# Patient Record
Sex: Female | Born: 1965 | Race: White | Hispanic: No | Marital: Married | Smoking: Current every day smoker
Health system: Southern US, Community
[De-identification: ages and names within clinical notes are randomized; demographics above are authoritative.]

## PROBLEM LIST (undated history)

## (undated) DIAGNOSIS — E079 Disorder of thyroid, unspecified: Secondary | ICD-10-CM

## (undated) DIAGNOSIS — K5792 Diverticulitis of intestine, part unspecified, without perforation or abscess without bleeding: Secondary | ICD-10-CM

## (undated) DIAGNOSIS — I10 Essential (primary) hypertension: Secondary | ICD-10-CM

## (undated) HISTORY — PX: CHOLECYSTECTOMY: SHX55

## (undated) HISTORY — DX: Essential (primary) hypertension: I10

## (undated) HISTORY — DX: Diverticulitis of intestine, part unspecified, without perforation or abscess without bleeding: K57.92

---

## 2015-07-15 ENCOUNTER — Encounter: Payer: Self-pay | Admitting: Family Medicine

## 2015-07-15 ENCOUNTER — Ambulatory Visit (INDEPENDENT_AMBULATORY_CARE_PROVIDER_SITE_OTHER): Payer: 59 | Admitting: Family Medicine

## 2015-07-15 VITALS — BP 190/108 | HR 103 | Ht 62.0 in | Wt 163.0 lb

## 2015-07-15 DIAGNOSIS — R1013 Epigastric pain: Secondary | ICD-10-CM

## 2015-07-15 DIAGNOSIS — F5089 Other specified eating disorder: Secondary | ICD-10-CM

## 2015-07-15 DIAGNOSIS — Z23 Encounter for immunization: Secondary | ICD-10-CM | POA: Diagnosis not present

## 2015-07-15 DIAGNOSIS — F411 Generalized anxiety disorder: Secondary | ICD-10-CM | POA: Diagnosis not present

## 2015-07-15 DIAGNOSIS — I10 Essential (primary) hypertension: Secondary | ICD-10-CM | POA: Insufficient documentation

## 2015-07-15 DIAGNOSIS — N951 Menopausal and female climacteric states: Secondary | ICD-10-CM

## 2015-07-15 DIAGNOSIS — R109 Unspecified abdominal pain: Secondary | ICD-10-CM | POA: Insufficient documentation

## 2015-07-15 MED ORDER — LISINOPRIL 10 MG PO TABS: 10.0000 mg | ORAL_TABLET | Freq: Every day | ORAL | Status: AC

## 2015-07-15 MED ORDER — BUSPIRONE HCL 5 MG PO TABS: 5.0000 mg | ORAL_TABLET | Freq: Three times a day (TID) | ORAL | Status: DC

## 2015-07-15 NOTE — Patient Instructions (Signed)
Thank you for coming in today. Start lisinopril.  Take buspar for anxiety as needed.  Attend therapy.  Return in 2 weeks.  Get fasting labs.  If your belly pain worsens, or you have high fever, bad vomiting, blood in your stool or black tarry stool go to the Emergency Room.   Cholelithiasis Cholelithiasis (also called gallstones) is a form of gallbladder disease in which gallstones form in your gallbladder. The gallbladder is an organ that stores bile made in the liver, which helps digest fats. Gallstones begin as small crystals and slowly grow into stones. Gallstone pain occurs when the gallbladder spasms and a gallstone is blocking the duct. Pain can also occur when a stone passes out of the duct.  RISK FACTORS  Being female.   Having multiple pregnancies. Health care providers sometimes advise removing diseased gallbladders before future pregnancies.   Being obese.  Eating a diet heavy in fried foods and fat.   Being older than 60 years and increasing age.   Prolonged use of medicines containing female hormones.   Having diabetes mellitus.   Rapidly losing weight.   Having a family history of gallstones (heredity).  SYMPTOMS  Nausea.   Vomiting.  Abdominal pain.   Yellowing of the skin (jaundice).   Sudden pain. It may persist from several minutes to several hours.  Fever.   Tenderness to the touch. In some cases, when gallstones do not move into the bile duct, people have no pain or symptoms. These are called "silent" gallstones.  TREATMENT Silent gallstones do not need treatment. In severe cases, emergency surgery may be required. Options for treatment include:  Surgery to remove the gallbladder. This is the most common treatment.  Medicines. These do not always work and may take 6-12 months or more to work.  Shock wave treatment (extracorporeal biliary lithotripsy). In this treatment an ultrasound machine sends shock waves to the gallbladder to  break gallstones into smaller pieces that can pass into the intestines or be dissolved by medicine. HOME CARE INSTRUCTIONS   Only take over-the-counter or prescription medicines for pain, discomfort, or fever as directed by your health care provider.   Follow a low-fat diet until seen again by your health care provider. Fat causes the gallbladder to contract, which can result in pain.   Follow up with your health care provider as directed. Attacks are almost always recurrent and surgery is usually required for permanent treatment.  SEEK IMMEDIATE MEDICAL CARE IF:   Your pain increases and is not controlled by medicines.   You have a fever or persistent symptoms for more than 2-3 days.   You have a fever and your symptoms suddenly get worse.   You have persistent nausea and vomiting.  MAKE SURE YOU:   Understand these instructions.  Will watch your condition.  Will get help right away if you are not doing well or get worse.   This information is not intended to replace advice given to you by your health care provider. Make sure you discuss any questions you have with your health care provider.   Document Released: 04/07/2005 Document Revised: 12/12/2012 Document Reviewed: 10/03/2012 Elsevier Interactive Patient Education 2016 [employer].  Hypertension Hypertension, commonly called high blood pressure, is when the force of blood pumping through your arteries is too strong. Your arteries are the blood vessels that carry blood from your heart throughout your body. A blood pressure reading consists of a higher number over a lower number, such as 110/72. The higher  number (systolic) is the pressure inside your arteries when your heart pumps. The lower number (diastolic) is the pressure inside your arteries when your heart relaxes. Ideally you want your blood pressure below 120/80. Hypertension forces your heart to work harder to pump blood. Your arteries may become narrow or  stiff. Having untreated or uncontrolled hypertension can cause heart attack, stroke, kidney disease, and other problems. RISK FACTORS Some risk factors for high blood pressure are controllable. Others are not.  Risk factors you cannot control include:   Race. You may be at higher risk if you are African American.  Age. Risk increases with age.  Gender. Men are at higher risk than women before age 67 years. After age 75, women are at higher risk than men. Risk factors you can control include:  Not getting enough exercise or physical activity.  Being overweight.  Getting too much fat, sugar, calories, or salt in your diet.  Drinking too much alcohol. SIGNS AND SYMPTOMS Hypertension does not usually cause signs or symptoms. Extremely high blood pressure (hypertensive crisis) may cause headache, anxiety, shortness of breath, and nosebleed. DIAGNOSIS To check if you have hypertension, your health care provider will measure your blood pressure while you are seated, with your arm held at the level of your heart. It should be measured at least twice using the same arm. Certain conditions can cause a difference in blood pressure between your right and left arms. A blood pressure reading that is higher than normal on one occasion does not mean that you need treatment. If it is not clear whether you have high blood pressure, you may be asked to return on a different day to have your blood pressure checked again. Or, you may be asked to monitor your blood pressure at home for 1 or more weeks. TREATMENT Treating high blood pressure includes making lifestyle changes and possibly taking medicine. Living a healthy lifestyle can help lower high blood pressure. You may need to change some of your habits. Lifestyle changes may include:  Following the DASH diet. This diet is high in fruits, vegetables, and whole grains. It is low in salt, red meat, and added sugars.  Keep your sodium intake below 2,300 mg  per day.  Getting at least 30-45 minutes of aerobic exercise at least 4 times per week.  Losing weight if necessary.  Not smoking.  Limiting alcoholic beverages.  Learning ways to reduce stress. Your health care provider may prescribe medicine if lifestyle changes are not enough to get your blood pressure under control, and if one of the following is true:  You are 3-62 years of age and your systolic blood pressure is above 140.  You are 71 years of age or older, and your systolic blood pressure is above 150.  Your diastolic blood pressure is above 90.  You have diabetes, and your systolic blood pressure is over 140 or your diastolic blood pressure is over 90.  You have kidney disease and your blood pressure is above 140/90.  You have heart disease and your blood pressure is above 140/90. Your personal target blood pressure may vary depending on your medical conditions, your age, and other factors. HOME CARE INSTRUCTIONS  Have your blood pressure rechecked as directed by your health care provider.   Take medicines only as directed by your health care provider. Follow the directions carefully. Blood pressure medicines must be taken as prescribed. The medicine does not work as well when you skip doses. Skipping doses also puts  you at risk for problems.  Do not smoke.   Monitor your blood pressure at home as directed by your health care provider. SEEK MEDICAL CARE IF:   You think you are having a reaction to medicines taken.  You have recurrent headaches or feel dizzy.  You have swelling in your ankles.  You have trouble with your vision. SEEK IMMEDIATE MEDICAL CARE IF:  You develop a severe headache or confusion.  You have unusual weakness, numbness, or feel faint.  You have severe chest or abdominal pain.  You vomit repeatedly.  You have trouble breathing. MAKE SURE YOU:   Understand these instructions.  Will watch your condition.  Will get help right  away if you are not doing well or get worse.   This information is not intended to replace advice given to you by your health care provider. Make sure you discuss any questions you have with your health care provider.   Document Released: 04/11/2005 Document Revised: 08/26/2014 Document Reviewed: 02/01/2013 Elsevier Interactive Patient Education [employer]2016 Elsevier Inc.

## 2015-07-16 DIAGNOSIS — N951 Menopausal and female climacteric states: Secondary | ICD-10-CM | POA: Insufficient documentation

## 2015-07-16 NOTE — Assessment & Plan Note (Signed)
Start lisinopril. Obtain fasting labs including CBC CMP and lipids

## 2015-07-16 NOTE — Assessment & Plan Note (Signed)
Check CBC ferritin and TIBC

## 2015-07-16 NOTE — Progress Notes (Signed)
       Jeanette Hughes is a 50 y.o. female who presents to [facility]: Primary Care today for establish care.  1) anxiety: Patient has had a several month history of anxiety worsening recently. She notes she typically has anxiety symptoms at work and feels overwhelmed and as though she would like to go home. She denies any panic attacks. She has not had any treatment or evaluation for this problem yet.  2) hypertension: Patient has been diagnosed with hypertension in the past. She previously was taking lisinopril 2.5 mg. No chest pains palpitations or shortness of breath.  3) history of anemia: Patient has a history of iron deficiency anemia. She currently experiences ice pica.   4) possible perimenopausal: Patient notes that she currently is perimenopausal with her last visit. In February. She typically has heavy menstrual periods. She drinks his mood changes and some hot flashes.  5) additionally patient notes occasional epigastric abdominal pains. These occur about 30-60 minutes after eating a heavy fatty meal. Been ongoing now for months. This does not happen every day. She currently is pain-free. She denies any vomiting or diarrhea or blood in the stool.   Past Medical History  Diagnosis Date  . Hypertension   . Diverticulitis    History reviewed. No pertinent past surgical history. Social History  Substance Use Topics  . Smoking status: Current Every Day Smoker  . Smokeless tobacco: Not on file  . Alcohol Use: Yes   family history includes Cancer in her sister; Heart disease in her father and mother.  ROS:  No headache, visual changes, nausea, vomiting, diarrhea, constipation, dizziness, , skin rash, fevers, chills, night sweats, weight loss, swollen lymph nodes, body aches, joint swelling, muscle aches, chest pain, shortness of breath, , visual or auditory hallucinations.    Medications: Current Outpatient Prescriptions  Medication Sig Dispense Refill  . levothyroxine (SYNTHROID, LEVOTHROID) 200 MCG tablet Take by mouth.    Marland Kitchen. omeprazole (PRILOSEC) 10 MG capsule Take by mouth.    . busPIRone (BUSPAR) 5 MG tablet Take 1 tablet (5 mg total) by mouth 3 (three) times daily. 90 tablet 0  . lisinopril (PRINIVIL,ZESTRIL) 10 MG tablet Take 1 tablet (10 mg total) by mouth daily. 30 tablet 3   No current facility-administered medications for this visit.   No Known Allergies   Exam:  BP 190/108 mmHg  Pulse 103  Ht 5\' 2"  (1.575 m)  Wt 163 lb (73.936 kg)  BMI 29.81 kg/m2 Gen: Well NAD HEENT: EOMI,  MMM Lungs: Normal work of breathing. CTABL Heart: RRR no MRG Abd: NABS, Soft. Nondistended, Nontender negative Murphy sign  Exts: Brisk capillary refill, warm and well perfused.  Psych: Alert and oriented normal speech thought process and affect  PHQ9 is 7 GAD 7 is 9  No results found for this or any previous visit (from the past 24 hour(s)). No results found.   Please see individual assessment and plan sections.   Patient was given a Tdap prior to discharge.

## 2015-07-16 NOTE — Assessment & Plan Note (Signed)
Etiology. Possible cholelithiasis versus pancreatitis versus transaminitis. Discussed options. CMP pending.

## 2015-07-16 NOTE — Assessment & Plan Note (Addendum)
I think patient likely has general anxiety disorder. She is reluctant to start taking medicines daily. Use buspirone. Refer to counseling. Check TSH. Recheck in 2-4 weeks

## 2015-07-16 NOTE — Assessment & Plan Note (Signed)
Check estrogen progesterone FSH and LH. Refer to OB/GYN for replacement therapy as needed

## 2015-07-22 LAB — CBC
HCT: 34.6 % — ABNORMAL LOW (ref 36.0–46.0)
HEMOGLOBIN: 10.2 g/dL — AB (ref 12.0–15.0)
MCH: 20.1 pg — ABNORMAL LOW (ref 26.0–34.0)
MCHC: 29.5 g/dL — ABNORMAL LOW (ref 30.0–36.0)
MCV: 68.1 fL — ABNORMAL LOW (ref 78.0–100.0)
MPV: 9 fL (ref 8.6–12.4)
PLATELETS: 382 10*3/uL (ref 150–400)
RBC: 5.08 MIL/uL (ref 3.87–5.11)
RDW: 18.2 % — ABNORMAL HIGH (ref 11.5–15.5)
WBC: 10.9 10*3/uL — AB (ref 4.0–10.5)

## 2015-07-23 ENCOUNTER — Encounter: Payer: Self-pay | Admitting: Family Medicine

## 2015-07-23 DIAGNOSIS — D509 Iron deficiency anemia, unspecified: Secondary | ICD-10-CM | POA: Insufficient documentation

## 2015-07-23 DIAGNOSIS — R7989 Other specified abnormal findings of blood chemistry: Secondary | ICD-10-CM | POA: Insufficient documentation

## 2015-07-23 DIAGNOSIS — E559 Vitamin D deficiency, unspecified: Secondary | ICD-10-CM | POA: Insufficient documentation

## 2015-07-23 DIAGNOSIS — R7303 Prediabetes: Secondary | ICD-10-CM | POA: Insufficient documentation

## 2015-07-23 LAB — COMPREHENSIVE METABOLIC PANEL
ALBUMIN: 4.3 g/dL (ref 3.6–5.1)
ALT: 16 U/L (ref 6–29)
AST: 15 U/L (ref 10–35)
Alkaline Phosphatase: 63 U/L (ref 33–115)
BILIRUBIN TOTAL: 0.3 mg/dL (ref 0.2–1.2)
BUN: 18 mg/dL (ref 7–25)
CO2: 22 mmol/L (ref 20–31)
CREATININE: 0.85 mg/dL (ref 0.50–1.10)
Calcium: 9.3 mg/dL (ref 8.6–10.2)
Chloride: 105 mmol/L (ref 98–110)
Glucose, Bld: 102 mg/dL — ABNORMAL HIGH (ref 65–99)
Potassium: 4.8 mmol/L (ref 3.5–5.3)
SODIUM: 137 mmol/L (ref 135–146)
TOTAL PROTEIN: 7.8 g/dL (ref 6.1–8.1)

## 2015-07-23 LAB — FSH/LH
FSH: 2.8 m[IU]/mL
LH: 6.4 m[IU]/mL

## 2015-07-23 LAB — HEMOGLOBIN A1C
HEMOGLOBIN A1C: 5.9 % — AB (ref <5.7)
MEAN PLASMA GLUCOSE: 123 mg/dL

## 2015-07-23 LAB — LIPID PANEL
CHOLESTEROL: 170 mg/dL (ref 125–200)
HDL: 62 mg/dL (ref >=46)
LDL CALC: 85 mg/dL (ref <130)
TRIGLYCERIDES: 116 mg/dL (ref <150)
Total CHOL/HDL Ratio: 2.7 Ratio (ref <=5.0)
VLDL: 23 mg/dL (ref <30)

## 2015-07-23 LAB — IRON AND TIBC
%SAT: 3 % — ABNORMAL LOW (ref 11–50)
Iron: 16 ug/dL — ABNORMAL LOW (ref 40–190)
TIBC: 596 ug/dL — AB (ref 250–450)
UIBC: 580 ug/dL — ABNORMAL HIGH (ref 125–400)

## 2015-07-23 LAB — VITAMIN D 25 HYDROXY (VIT D DEFICIENCY, FRACTURES): VIT D 25 HYDROXY: 11 ng/mL — AB (ref 30–100)

## 2015-07-23 LAB — LIPASE: Lipase: 22 U/L (ref 7–60)

## 2015-07-23 LAB — FERRITIN: FERRITIN: 5 ng/mL — AB (ref 10–232)

## 2015-07-23 LAB — PROGESTERONE: Progesterone: 10.6 ng/mL

## 2015-07-23 LAB — TSH: TSH: 70.43 mIU/L — ABNORMAL HIGH

## 2015-07-23 MED ORDER — VITAMIN D (ERGOCALCIFEROL) 1.25 MG (50000 UNIT) PO CAPS
50000.0000 [IU] | ORAL_CAPSULE | ORAL | Status: AC
Start: 2015-07-23 — End: ?

## 2015-07-23 MED ORDER — FERROUS SULFATE 325 (65 FE) MG PO TBEC: 325.0000 mg | DELAYED_RELEASE_TABLET | Freq: Three times a day (TID) | ORAL | Status: DC

## 2015-07-23 NOTE — Progress Notes (Signed)
Quick Note:  Return soon to go over the labs and get some more tests.  1) Severe iron deficiency is noted. We will start taking oral iron. Take ferrous sulfate 325mg  three times daily (if able). These are avaliable over the counter but I will try to prescribe them.  2) Thyroid labs are really abnormal. We will need to repeat them. Has Ms Jeanette Hughes been off the thryoid medicine for a while? 3) Vit D is really low. I will prescribe some.  4) Prediabetes is present. Diet and weight loss.  5) Cholesterol is ok.  6) Menopause labs are pending. ______

## 2015-07-23 NOTE — Addendum Note (Signed)
Addended by: Rodolph BongOREY, Deetra Booton S on: 07/23/2015 08:03 AM   Modules accepted: Orders, SmartSet

## 2015-07-26 LAB — ESTROGENS, TOTAL: Estrogen: 435 pg/mL

## 2015-07-27 NOTE — Progress Notes (Signed)
Quick Note:  Menopause labs do not show menopause ______

## 2015-07-28 ENCOUNTER — Telehealth: Payer: Self-pay

## 2015-07-28 MED ORDER — LEVOTHYROXINE SODIUM 200 MCG PO TABS: 200.0000 ug | ORAL_TABLET | Freq: Every day | ORAL | Status: DC

## 2015-07-28 NOTE — Telephone Encounter (Signed)
TSH was really high. I am not sure what the levothyroxine dose should be. I sent in a one-month supply. Patient should return in the near future for repeat testing.

## 2015-07-28 NOTE — Telephone Encounter (Signed)
Pt called stating that she needs a refill on levothyroxine 0.25mg . She also wanted you to know that the iron supplements have made her fell better then she has in a long time.

## 2015-07-29 NOTE — Telephone Encounter (Signed)
Pt notified and advised to FU in 1 month to recheck TSH.

## 2015-08-31 ENCOUNTER — Telehealth: Payer: Self-pay | Admitting: *Deleted

## 2015-08-31 MED ORDER — LEVOTHYROXINE SODIUM 200 MCG PO TABS: 200.0000 ug | ORAL_TABLET | Freq: Every day | ORAL | Status: DC

## 2015-08-31 NOTE — Telephone Encounter (Signed)
Pt called requesting a refill on thyroid medication. Last labs show thyroid labs were very abnormal. Spoke with patient she had not been taking them regularly and she still has some pills left. Pt has a f/u appt with Dr. Denyse Amassorey on 5/18. Refilled this med until she comes in to see you. Please advise if any thing further

## 2015-09-10 ENCOUNTER — Ambulatory Visit (INDEPENDENT_AMBULATORY_CARE_PROVIDER_SITE_OTHER): Payer: 59 | Admitting: Family Medicine

## 2015-09-10 ENCOUNTER — Encounter: Payer: Self-pay | Admitting: Family Medicine

## 2015-09-10 VITALS — BP 170/110 | HR 77 | Wt 155.0 lb

## 2015-09-10 DIAGNOSIS — I10 Essential (primary) hypertension: Secondary | ICD-10-CM

## 2015-09-10 DIAGNOSIS — R7303 Prediabetes: Secondary | ICD-10-CM

## 2015-09-10 DIAGNOSIS — R7989 Other specified abnormal findings of blood chemistry: Secondary | ICD-10-CM

## 2015-09-10 DIAGNOSIS — R1013 Epigastric pain: Secondary | ICD-10-CM | POA: Diagnosis not present

## 2015-09-10 DIAGNOSIS — D509 Iron deficiency anemia, unspecified: Secondary | ICD-10-CM

## 2015-09-10 DIAGNOSIS — R946 Abnormal results of thyroid function studies: Secondary | ICD-10-CM

## 2015-09-10 DIAGNOSIS — E559 Vitamin D deficiency, unspecified: Secondary | ICD-10-CM | POA: Diagnosis not present

## 2015-09-10 DIAGNOSIS — N920 Excessive and frequent menstruation with regular cycle: Secondary | ICD-10-CM | POA: Insufficient documentation

## 2015-09-10 LAB — T4, FREE: Free T4: 1.2 ng/dL (ref 0.8–1.8)

## 2015-09-10 LAB — FERRITIN: Ferritin: 17 ng/mL (ref 10–232)

## 2015-09-10 LAB — CBC
HCT: 40.6 % (ref 35.0–45.0)
Hemoglobin: 12.9 g/dL (ref 11.7–15.5)
MCH: 25.1 pg — AB (ref 27.0–33.0)
MCHC: 31.8 g/dL — AB (ref 32.0–36.0)
MCV: 79.1 fL — AB (ref 80.0–100.0)
MPV: 9.5 fL (ref 7.5–12.5)
PLATELETS: 360 10*3/uL (ref 140–400)
RBC: 5.13 MIL/uL — ABNORMAL HIGH (ref 3.80–5.10)
WBC: 6.7 10*3/uL (ref 3.8–10.8)

## 2015-09-10 LAB — IRON AND TIBC
%SAT: 7 % — ABNORMAL LOW (ref 11–50)
Iron: 31 ug/dL — ABNORMAL LOW (ref 40–190)
TIBC: 418 ug/dL (ref 250–450)
UIBC: 387 ug/dL (ref 125–400)

## 2015-09-10 LAB — TSH: TSH: 1.81 mIU/L

## 2015-09-10 LAB — T3, FREE: T3, Free: 2.6 pg/mL (ref 2.3–4.2)

## 2015-09-10 MED ORDER — NORETHINDRONE 0.35 MG PO TABS: 1.0000 | ORAL_TABLET | Freq: Every day | ORAL | Status: DC

## 2015-09-10 NOTE — Progress Notes (Signed)
       Jeanette Hughes is a 50 y.o. female who [facility] [facility] today for follow-up multiple medical problems.  1) abdominal pain: Patient was seen 2 months ago for abdominal pain. Initial lab workup was inconclusive. She notes continued upper right quadrant abdominal pain worse with eating fatty foods. She is concerned she may have gallbladder disease. She has not eaten in over 6 hours. No fevers or chills vomiting or diarrhea. She currently takes omeprazole.  2) iron deficiency anemia: Patient was diagnosed with severe iron deficiency with moderate anemia the last visit. She's been taking oral iron. She notes she cannot take this medicine more than once daily due to constipation and upset stomach. She continues to have heavy menstrual bleeding but feels better overall with iron repletion.  3) thyroid: Patient had significantly elevated TSH the last visit. She notes that she had not been taking her thyroid medicine prior to this lab test. She has been taking her medicines but somewhat inconsistently. She denies any hair loss significant weight gain feeling too hot or feeling cold.  4) heavy menstrual bleeding: Patient notes heavy menstrual periods. She thinks this is the cause of her iron deficiency. She is reluctant to take oral estrogen-containing contraceptives due to her smoking status and risk of DVT/PE.  5) hypertension: Patient did not take her blood pressure medication this morning. No chest pains palpitations shortness of breath.     Past Medical History  Diagnosis Date  . Hypertension   . Diverticulitis    No past surgical history on file. Social History  Substance Use Topics  . Smoking status: Current Every Day Smoker  . Smokeless tobacco: Not on file  . Alcohol Use: Yes   family history includes Cancer in her sister; Heart disease in her father and mother.  ROS as  above Medications: Current Outpatient Prescriptions  Medication Sig Dispense Refill  . ferrous sulfate 325 (65 FE) MG EC tablet Take 1 tablet (325 mg total) by mouth 3 (three) times daily with meals. 90 tablet 11  . levothyroxine (SYNTHROID, LEVOTHROID) 200 MCG tablet Take 1 tablet (200 mcg total) by mouth daily before breakfast. 30 tablet 0  . lisinopril (PRINIVIL,ZESTRIL) 10 MG tablet Take 1 tablet (10 mg total) by mouth daily. 30 tablet 3  . omeprazole (PRILOSEC) 10 MG capsule Take by mouth.    . Vitamin D, Ergocalciferol, (DRISDOL) 50000 units CAPS capsule Take 1 capsule (50,000 Units total) by mouth every 7 (seven) days. Take for 8 total doses(weeks) 8 capsule 0   No current facility-administered medications for this visit.   No Known Allergies   Exam:  BP 170/110 mmHg  Pulse 77  Wt 155 lb (70.308 kg) Gen: Well NAD HEENT: EOMI,  MMM Lungs: Normal work of breathing. CTABL Heart: RRR no MRG Abd: NABS, Soft. Nondistended, Nontender Exts: Brisk capillary refill, warm and well perfused.   No results found for this or any previous visit (from the past 24 hour(s)). No results found.   Please see individual assessment and plan sections.

## 2015-09-10 NOTE — Assessment & Plan Note (Signed)
Status post partial oral repletion. Check CBC ferritin and iron binding capacity.

## 2015-09-10 NOTE — Assessment & Plan Note (Signed)
Continue lisinopril. Recheck in one month.

## 2015-09-10 NOTE — Assessment & Plan Note (Signed)
Recheck TSH free T4 and free T3.

## 2015-09-10 NOTE — Patient Instructions (Signed)
Thank you for coming in today. Get a pill box to help take your medicines more consistently. Get labs and ultrasound today. Return in one month.

## 2015-09-10 NOTE — Assessment & Plan Note (Signed)
Treat with oral progesterone only containing birth control pills to help regulate menstrual cycle. If not effective would consider IUD or endometrial ablation.

## 2015-09-10 NOTE — Assessment & Plan Note (Signed)
Check vitamin D. 

## 2015-09-10 NOTE — Assessment & Plan Note (Signed)
Concerning for gallbladder etiology. Check abdominal ultrasound.

## 2015-09-11 LAB — VITAMIN D 25 HYDROXY (VIT D DEFICIENCY, FRACTURES): Vit D, 25-Hydroxy: 35 ng/mL (ref 30–100)

## 2015-09-11 NOTE — Progress Notes (Signed)
Quick Note:  1) Vit D looks good. Continue the OTC doses.  2) Iron is improving but not fully better. Continue to take iron as much as you can.  3) Thyroid looks normal now. ______

## 2015-09-18 ENCOUNTER — Other Ambulatory Visit: Payer: 59

## 2015-09-22 ENCOUNTER — Ambulatory Visit (INDEPENDENT_AMBULATORY_CARE_PROVIDER_SITE_OTHER): Payer: 59

## 2015-09-22 DIAGNOSIS — K8020 Calculus of gallbladder without cholecystitis without obstruction: Secondary | ICD-10-CM | POA: Diagnosis not present

## 2015-09-22 DIAGNOSIS — R1013 Epigastric pain: Secondary | ICD-10-CM

## 2015-09-23 ENCOUNTER — Telehealth: Payer: Self-pay | Admitting: Family Medicine

## 2015-09-23 DIAGNOSIS — K8020 Calculus of gallbladder without cholecystitis without obstruction: Secondary | ICD-10-CM | POA: Insufficient documentation

## 2015-09-23 DIAGNOSIS — K8010 Calculus of gallbladder with chronic cholecystitis without obstruction: Secondary | ICD-10-CM

## 2015-09-23 NOTE — Telephone Encounter (Signed)
Will refer to general surgery.   

## 2015-09-23 NOTE — Progress Notes (Signed)
Quick Note:  Ultrasound shows gallstones and gallbladder wall thickening consistent with inflammation and irritation. Plan refer to general surgery ______

## 2015-10-08 ENCOUNTER — Telehealth: Payer: Self-pay

## 2015-10-08 NOTE — Telephone Encounter (Signed)
Pt called stating that the general surgeon she was referred to is out of her insurance network and would like a recommendation for a surgeon in her network. Advised pt to contact her insurance provider and request information on a provider in her network. Requested a call back with this information. Pt verbalized understanding.

## 2015-10-09 ENCOUNTER — Ambulatory Visit: Payer: Self-pay | Admitting: General Surgery

## 2015-10-20 ENCOUNTER — Other Ambulatory Visit: Payer: Self-pay | Admitting: Family Medicine

## 2016-03-19 ENCOUNTER — Other Ambulatory Visit: Payer: Self-pay | Admitting: Family Medicine

## 2016-03-20 ENCOUNTER — Other Ambulatory Visit: Payer: Self-pay | Admitting: Family Medicine

## 2019-01-28 ENCOUNTER — Other Ambulatory Visit (HOSPITAL_COMMUNITY): Payer: Self-pay | Admitting: Nurse Practitioner

## 2019-01-28 DIAGNOSIS — Z1231 Encounter for screening mammogram for malignant neoplasm of breast: Secondary | ICD-10-CM

## 2020-09-03 ENCOUNTER — Emergency Department (HOSPITAL_COMMUNITY): Payer: BC Managed Care – PPO

## 2020-09-03 ENCOUNTER — Encounter (HOSPITAL_COMMUNITY): Payer: Self-pay

## 2020-09-03 ENCOUNTER — Observation Stay (HOSPITAL_COMMUNITY)
Admission: EM | Admit: 2020-09-03 | Discharge: 2020-09-04 | Disposition: A | Discharge status: Home / Self Care | Payer: BC Managed Care – PPO | Attending: Internal Medicine | Admitting: Internal Medicine

## 2020-09-03 ENCOUNTER — Inpatient Hospital Stay (HOSPITAL_COMMUNITY): Payer: BC Managed Care – PPO

## 2020-09-03 ENCOUNTER — Other Ambulatory Visit: Payer: Self-pay

## 2020-09-03 DIAGNOSIS — Z20822 Contact with and (suspected) exposure to covid-19: Secondary | ICD-10-CM | POA: Diagnosis not present

## 2020-09-03 DIAGNOSIS — R7303 Prediabetes: Secondary | ICD-10-CM | POA: Insufficient documentation

## 2020-09-03 DIAGNOSIS — I10 Essential (primary) hypertension: Secondary | ICD-10-CM | POA: Diagnosis not present

## 2020-09-03 DIAGNOSIS — R42 Dizziness and giddiness: Secondary | ICD-10-CM | POA: Diagnosis present

## 2020-09-03 DIAGNOSIS — I639 Cerebral infarction, unspecified: Secondary | ICD-10-CM | POA: Diagnosis not present

## 2020-09-03 DIAGNOSIS — Z79899 Other long term (current) drug therapy: Secondary | ICD-10-CM | POA: Diagnosis not present

## 2020-09-03 DIAGNOSIS — E039 Hypothyroidism, unspecified: Secondary | ICD-10-CM | POA: Insufficient documentation

## 2020-09-03 DIAGNOSIS — F17210 Nicotine dependence, cigarettes, uncomplicated: Secondary | ICD-10-CM | POA: Diagnosis not present

## 2020-09-03 DIAGNOSIS — Z7982 Long term (current) use of aspirin: Secondary | ICD-10-CM | POA: Diagnosis not present

## 2020-09-03 DIAGNOSIS — E782 Mixed hyperlipidemia: Secondary | ICD-10-CM | POA: Insufficient documentation

## 2020-09-03 DIAGNOSIS — Z91040 Latex allergy status: Secondary | ICD-10-CM | POA: Insufficient documentation

## 2020-09-03 DIAGNOSIS — H538 Other visual disturbances: Secondary | ICD-10-CM

## 2020-09-03 HISTORY — DX: Disorder of thyroid, unspecified: E07.9

## 2020-09-03 LAB — CBC WITH DIFFERENTIAL/PLATELET
Abs Immature Granulocytes: 0.02 10*3/uL (ref 0.00–0.07)
Basophils Absolute: 0.1 10*3/uL (ref 0.0–0.1)
Basophils Relative: 1 %
Eosinophils Absolute: 0.2 10*3/uL (ref 0.0–0.5)
Eosinophils Relative: 2 %
HCT: 36 % (ref 36.0–46.0)
Hemoglobin: 11 g/dL — ABNORMAL LOW (ref 12.0–15.0)
Immature Granulocytes: 0 %
Lymphocytes Relative: 25 %
Lymphs Abs: 2.2 10*3/uL (ref 0.7–4.0)
MCH: 26.4 pg (ref 26.0–34.0)
MCHC: 30.6 g/dL (ref 30.0–36.0)
MCV: 86.3 fL (ref 80.0–100.0)
Monocytes Absolute: 0.6 10*3/uL (ref 0.1–1.0)
Monocytes Relative: 7 %
Neutro Abs: 5.6 10*3/uL (ref 1.7–7.7)
Neutrophils Relative %: 65 %
Platelets: 338 10*3/uL (ref 150–400)
RBC: 4.17 MIL/uL (ref 3.87–5.11)
RDW: 14.6 % (ref 11.5–15.5)
WBC: 8.7 10*3/uL (ref 4.0–10.5)
nRBC: 0 % (ref 0.0–0.2)

## 2020-09-03 LAB — COMPREHENSIVE METABOLIC PANEL
ALT: 20 U/L (ref 0–44)
AST: 18 U/L (ref 15–41)
Albumin: 3.7 g/dL (ref 3.5–5.0)
Alkaline Phosphatase: 69 U/L (ref 38–126)
Anion gap: 7 (ref 5–15)
BUN: 14 mg/dL (ref 6–20)
CO2: 24 mmol/L (ref 22–32)
Calcium: 8.6 mg/dL — ABNORMAL LOW (ref 8.9–10.3)
Chloride: 105 mmol/L (ref 98–111)
Creatinine, Ser: 0.73 mg/dL (ref 0.44–1.00)
GFR, Estimated: >60 mL/min (ref >60)
Glucose, Bld: 107 mg/dL — ABNORMAL HIGH (ref 70–99)
Potassium: 4.3 mmol/L (ref 3.5–5.1)
Sodium: 136 mmol/L (ref 135–145)
Total Bilirubin: 0.3 mg/dL (ref 0.3–1.2)
Total Protein: 7.2 g/dL (ref 6.5–8.1)

## 2020-09-03 LAB — TSH: TSH: 2.278 u[IU]/mL (ref 0.350–4.500)

## 2020-09-03 MED ORDER — CLOPIDOGREL BISULFATE 75 MG PO TABS: 75.0000 mg | ORAL_TABLET | Freq: Once | ORAL | Status: DC

## 2020-09-03 MED ORDER — PROCHLORPERAZINE EDISYLATE 10 MG/2ML IJ SOLN
10.0000 mg | Freq: Once | INTRAMUSCULAR | Status: AC
  Administered 2020-09-03: 10 mg via INTRAVENOUS
  Filled 2020-09-03: qty 2

## 2020-09-03 MED ORDER — STROKE: EARLY STAGES OF RECOVERY BOOK
Freq: Once | Status: AC
Start: 2020-09-03 — End: 2020-09-03
  Filled 2020-09-03: qty 1

## 2020-09-03 MED ORDER — ACETAMINOPHEN 650 MG RE SUPP: 650.0000 mg | RECTAL | Status: DC | PRN

## 2020-09-03 MED ORDER — CLONAZEPAM 0.5 MG PO TABS: 0.2500 mg | ORAL_TABLET | Freq: Every evening | ORAL | Status: DC | PRN

## 2020-09-03 MED ORDER — ENOXAPARIN SODIUM 40 MG/0.4ML IJ SOSY
40.0000 mg | PREFILLED_SYRINGE | INTRAMUSCULAR | Status: DC
  Administered 2020-09-03: 40 mg via SUBCUTANEOUS
  Filled 2020-09-03: qty 0.4

## 2020-09-03 MED ORDER — CLOPIDOGREL BISULFATE 75 MG PO TABS
75.0000 mg | ORAL_TABLET | Freq: Every day | ORAL | Status: DC
  Administered 2020-09-04: 75 mg via ORAL
  Filled 2020-09-03: qty 1

## 2020-09-03 MED ORDER — ACETAMINOPHEN 160 MG/5ML PO SOLN: 650.0000 mg | ORAL | Status: DC | PRN

## 2020-09-03 MED ORDER — ASPIRIN 81 MG PO CHEW: 81.0000 mg | CHEWABLE_TABLET | Freq: Once | ORAL | Status: DC

## 2020-09-03 MED ORDER — ASPIRIN 81 MG PO CHEW
81.0000 mg | CHEWABLE_TABLET | Freq: Once | ORAL | Status: AC
  Administered 2020-09-03: 81 mg via ORAL
  Filled 2020-09-03: qty 1

## 2020-09-03 MED ORDER — DIPHENHYDRAMINE HCL 50 MG/ML IJ SOLN
25.0000 mg | Freq: Once | INTRAMUSCULAR | Status: AC
  Administered 2020-09-03: 25 mg via INTRAVENOUS
  Filled 2020-09-03: qty 1

## 2020-09-03 MED ORDER — CLOPIDOGREL BISULFATE 75 MG PO TABS
75.0000 mg | ORAL_TABLET | Freq: Once | ORAL | Status: AC
  Administered 2020-09-03: 75 mg via ORAL
  Filled 2020-09-03: qty 1

## 2020-09-03 MED ORDER — ACETAMINOPHEN 325 MG PO TABS
650.0000 mg | ORAL_TABLET | ORAL | Status: DC | PRN
  Administered 2020-09-03 – 2020-09-04 (×3): 650 mg via ORAL
  Filled 2020-09-03 (×3): qty 2

## 2020-09-03 MED ORDER — NICOTINE 21 MG/24HR TD PT24
21.0000 mg | MEDICATED_PATCH | Freq: Every day | TRANSDERMAL | Status: DC
  Administered 2020-09-03 – 2020-09-04 (×2): 21 mg via TRANSDERMAL
  Filled 2020-09-03 (×2): qty 1

## 2020-09-03 MED ORDER — PANTOPRAZOLE SODIUM 40 MG PO TBEC
40.0000 mg | DELAYED_RELEASE_TABLET | Freq: Every day | ORAL | Status: DC
  Administered 2020-09-03 – 2020-09-04 (×2): 40 mg via ORAL
  Filled 2020-09-03 (×2): qty 1

## 2020-09-03 MED ORDER — IOHEXOL 350 MG/ML SOLN
75.0000 mL | Freq: Once | INTRAVENOUS | Status: AC | PRN
  Administered 2020-09-03: 75 mL via INTRAVENOUS

## 2020-09-03 MED ORDER — CLONAZEPAM 0.25 MG PO TBDP
0.2500 mg | ORAL_TABLET | Freq: Every evening | ORAL | Status: DC | PRN
  Administered 2020-09-03: 0.25 mg via ORAL
  Filled 2020-09-03: qty 1

## 2020-09-03 MED ORDER — LEVOTHYROXINE SODIUM 75 MCG PO TABS
150.0000 ug | ORAL_TABLET | Freq: Every day | ORAL | Status: DC
  Administered 2020-09-04: 150 ug via ORAL
  Filled 2020-09-03: qty 2

## 2020-09-03 MED ORDER — ASPIRIN EC 81 MG PO TBEC
81.0000 mg | DELAYED_RELEASE_TABLET | Freq: Every day | ORAL | Status: DC
  Administered 2020-09-04: 81 mg via ORAL
  Filled 2020-09-03: qty 1

## 2020-09-03 NOTE — ED Notes (Signed)
Gone to CT

## 2020-09-03 NOTE — H&P (Signed)
TRH H&P   Patient Demographics:    Jeanette Hughes, is a 55 y.o. female  MRN: [id number]   DOB - 1965/12/27  Admit Date - 09/03/2020  Outpatient Primary MD for the patient is Medicine, [facility]  Referring MD/NP/PA: Dr  Estell Harpin  Patient coming from: Home  Chief Complaint  Patient presents with  . Dizziness      HPI:    Jeanette Hughes  is a 55 y.o. female, with medical history of hypertension, tobacco abuse, prediabetes, GERD, she presents to ED secondary to complaints of blurry vision, headache, nausea, patient reports her symptoms started yesterday at 2 PM, all of a sudden, she thought it is related to migraine headache, she took migraine medicine without help, she reports some tingling numbness in bilateral upper extremities as well, resolved by now, and as symptoms did not improve by today, her husband brought her to ED, she denies any worsening symptoms, as well she denies no improvement of her symptoms either, she smokes 1 pack/day for last 30 years, she does report intermittent feeling of palpitation with exertion, but she denies any dyspnea, chest pain, fever or chills. 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 fib, (as well no history of A. fib), blood pressure overall acceptable in ED systolic 130s-150s), ED physician discussed with neurology on-call Dr. Darlyn Read, who recommended aspirin and Plavix and admission for further work-up, as well have discussed with neuro interventional call at [facility], at this point no indication for intervention given stable symptoms, and more than 24 hours of her symptoms, [facility] hospitalist consulted to admit.    Review of systems:    In addition to the HPI above, No Fever-chills, Reports headache,  blurry vision in left eye, and dizziness. No problems swallowing food or Liquids, No Chest pain, Cough or Shortness of Breath, No Abdominal pain, No Nausea or Vommitting, Bowel movements are regular, No Blood in stool or Urine, No dysuria, No new skin rashes or bruises, No new joints pains-aches,  No new weakness, tingling, numbness in any extremity, No recent weight gain or loss, No polyuria, polydypsia or polyphagia, No significant Mental Stressors.  A full 10 point Review of Systems was done, except as stated above, all other Review of Systems were negative.   With Past History of the following :    Past Medical History:  Diagnosis Date  . Diverticulitis   . Hypertension   . Thyroid disease       Past Surgical History:  Procedure Laterality Date  . CHOLECYSTECTOMY        Social History:     Social History   Tobacco Use  . Smoking status: Current Every Day Smoker    Packs/day: 1.00    Types: Cigarettes  .  Smokeless tobacco: Never Used  Substance Use Topics  . Alcohol use: Yes        Family History :     Family History  Problem Relation Age of Onset  . Heart disease Mother   . Heart disease Father   . Cancer Sister       Home Medications:   Prior to Admission medications   Medication Sig Start Date End Date Taking? Authorizing Provider  amLODipine (NORVASC) 5 MG tablet Take 1 tablet by mouth daily. 05/08/20  Yes [provider]  clonazePAM (KLONOPIN) 0.5 MG tablet Take 0.5 mg by mouth at bedtime. 07/03/20  Yes [provider]  levothyroxine (SYNTHROID) 150 MCG tablet Take 150 mcg by mouth daily. 08/22/20  Yes [provider]  lisinopril (PRINIVIL,ZESTRIL) 10 MG tablet Take 1 tablet (10 mg total) by mouth daily. Patient taking differently: Take 40 mg by mouth daily. 07/15/15  Yes Rodolph Bong, MD  naproxen sodium (ALEVE) 220 MG tablet Take 440 mg by mouth daily as needed (migraine).   Yes [provider]  omeprazole  (PRILOSEC) 10 MG capsule Take by mouth.   Yes [provider]  Vitamin D, Ergocalciferol, (DRISDOL) 50000 units CAPS capsule Take 1 capsule (50,000 Units total) by mouth every 7 (seven) days. Take for 8 total doses(weeks) 07/23/15  Yes Rodolph Bong, MD  zolpidem (AMBIEN) 10 MG tablet Take 10 mg by mouth at bedtime as needed for anxiety. Will take if she runs out of clonazepam 07/03/20  Yes [provider]  levothyroxine (SYNTHROID, LEVOTHROID) 200 MCG tablet Take 1 tablet (200 mcg total) by mouth daily before breakfast. Patient not taking: No sig reported 08/31/15   Rodolph Bong, MD  levothyroxine (SYNTHROID, LEVOTHROID) 200 MCG tablet TAKE 1 TABLET(200 MCG) BY MOUTH DAILY BEFORE BREAKFAST Patient not taking: No sig reported 03/21/16   Rodolph Bong, MD  norethindrone (ORTHO MICRONOR) 0.35 MG tablet Take 1 tablet (0.35 mg total) by mouth daily. Patient not taking: No sig reported 09/10/15   Rodolph Bong, MD     Allergies:     Allergies  Allergen Reactions  . Latex Rash    Burning skin     Physical Exam:   Vitals  Blood pressure (!) 153/91, pulse 63, temperature 99 F (37.2 C), temperature source Oral, resp. rate 14, height  (1.6 m), weight 77.1 kg, SpO2 98 %.   1. General alert female,  lying in bed in NAD,    2. Normal affect and insight, Not Suicidal or Homicidal, Awake Alert, Oriented X 3.  3. No F.N deficits, ALL C.Nerves Intact, no significant focal deficits in motor in all extremities, no dysmetria, she does have some blurry vision, he has decreased visual acuity, but I could not appreciate any visual field deficits .  4. Ears and Eyes appear Normal, Conjunctivae clear, PERRLA. Moist Oral Mucosa.  5. Supple Neck, No JVD, No cervical lymphadenopathy appriciated, No Carotid Bruits.  6. Symmetrical Chest wall movement, Good air movement bilaterally, CTAB.  7. RRR, No Gallops, Rubs or Murmurs, No Parasternal Heave.  8. Positive Bowel Sounds, Abdomen  Soft, No tenderness, No organomegaly appriciated,No rebound -guarding or rigidity.  9.  No Cyanosis, Normal Skin Turgor, No Skin Rash or Bruise.  10. Good muscle tone,  joints appear normal , no effusions, Normal ROM.  11. No Palpable Lymph Nodes in Neck or Axillae    Data Review:    CBC Recent Labs  Lab 09/03/20 1110  WBC 8.7  HGB 11.0*  HCT 36.0  PLT 338  MCV 86.3  MCH 26.4  MCHC 30.6  RDW 14.6  LYMPHSABS 2.2  MONOABS 0.6  EOSABS 0.2  BASOSABS 0.1   ------------------------------------------------------------------------------------------------------------------  Chemistries  Recent Labs  Lab 09/03/20 1110  NA 136  K 4.3  CL 105  CO2 24  GLUCOSE 107*  BUN 14  CREATININE 0.73  CALCIUM 8.6*  AST 18  ALT 20  ALKPHOS 69  BILITOT 0.3   ------------------------------------------------------------------------------------------------------------------ estimated creatinine clearance is 80 mL/min (by C-G formula based on SCr of 0.73 mg/dL). ------------------------------------------------------------------------------------------------------------------ No results for input(s): TSH, T4TOTAL, T3FREE, THYROIDAB in the last 72 hours.  Invalid input(s): FREET3  Coagulation profile No results for input(s): INR, PROTIME in the last 168 hours. ------------------------------------------------------------------------------------------------------------------- No results for input(s): DDIMER in the last 72 hours. -------------------------------------------------------------------------------------------------------------------  Cardiac Enzymes No results for input(s): CKMB, TROPONINI, MYOGLOBIN in the last 168 hours.  Invalid input(s): CK ------------------------------------------------------------------------------------------------------------------ No results found for:  BNP   ---------------------------------------------------------------------------------------------------------------  Urinalysis No results found for: COLORURINE, APPEARANCEUR, LABSPEC, PHURINE, GLUCOSEU, HGBUR, BILIRUBINUR, KETONESUR, PROTEINUR, UROBILINOGEN, NITRITE, LEUKOCYTESUR  ----------------------------------------------------------------------------------------------------------------   Imaging Results:    CT Angio Head W or Wo Contrast  Result Date: 09/03/2020 CLINICAL DATA:  Headache.  Loss of vision on the left EXAM: CT ANGIOGRAPHY HEAD AND NECK TECHNIQUE: Multidetector CT imaging of the head and neck was performed using the standard protocol during bolus administration of intravenous contrast. Multiplanar CT image reconstructions and MIPs were obtained to evaluate the vascular anatomy. Carotid stenosis measurements (when applicable) are obtained utilizing NASCET criteria, using the distal internal carotid diameter as the denominator. CONTRAST:  39mL OMNIPAQUE IOHEXOL 350 MG/ML SOLN COMPARISON:  None. FINDINGS: CT HEAD FINDINGS Brain: Brainstem and cerebellum are normal. Question small area of low-density in the right occipital cortical brain, best seen axial image 15, which could represent a right occipital infarction. The brain otherwise appears normal. No hemorrhage, hydrocephalus, mass lesion or extra-axial collection. Vascular: No abnormal vascular finding. Skull: Normal Sinuses: Clear Orbits: Normal Review of the MIP images confirms the above findings CTA NECK FINDINGS Aortic arch: Normal Right carotid system: Normal. Carotid bifurcation is normal. Cervical ICA is normal. Left carotid system: Normal. Carotid bifurcation is normal. Cervical ICA is normal. Vertebral arteries: Both vertebral artery origins are widely patent. The left vertebral artery is dominant. Both vertebral arteries appear normal through the cervical region to the foramen magnum. Skeleton: Ordinary cervical  spondylosis. Other neck: No mass or lymphadenopathy. Upper chest: Normal Review of the MIP images confirms the above findings CTA HEAD FINDINGS Anterior circulation: Both internal carotid arteries are widely patent through the skull base and siphon regions. The anterior and middle cerebral vessels are normal. No large vessel occlusion. No aneurysm or vascular malformation. Posterior circulation: Both vertebral arteries are widely patent to the basilar. No basilar stenosis. Left PCA is normal. Right PCA is nearly occluded 1 cm from the origin. There does appear to be minor thready distal flow. This is most consistent with embolic occlusion. Venous sinuses: Patent and normal. Anatomic variants: None significant. 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 No other abnormal vascular finding. Focal low-density in the right occipital lobe consistent with an acute right occipital stroke. No hemorrhage or mass effect. Findings discussed with Dr. Estell Harpin at 1500 hours. Electronically Signed   By: Paulina Fusi M.D.   On: 09/03/2020 15:02   CT Angio Neck W  and/or Wo Contrast  Result Date: 09/03/2020 CLINICAL DATA:  Headache.  Loss of vision on the left EXAM: CT ANGIOGRAPHY HEAD AND NECK TECHNIQUE: Multidetector CT imaging of the head and neck was performed using the standard protocol during bolus administration of intravenous contrast. Multiplanar CT image reconstructions and MIPs were obtained to evaluate the vascular anatomy. Carotid stenosis measurements (when applicable) are obtained utilizing NASCET criteria, using the distal internal carotid diameter as the denominator. CONTRAST:  75mL OMNIPAQUE IOHEXOL 350 MG/ML SOLN COMPARISON:  None. FINDINGS: CT HEAD FINDINGS Brain: Brainstem and cerebellum are normal. Question small area of low-density in the right occipital cortical brain, best seen axial image 15, which could represent a right occipital  infarction. The brain otherwise appears normal. No hemorrhage, hydrocephalus, mass lesion or extra-axial collection. Vascular: No abnormal vascular finding. Skull: Normal Sinuses: Clear Orbits: Normal Review of the MIP images confirms the above findings CTA NECK FINDINGS Aortic arch: Normal Right carotid system: Normal. Carotid bifurcation is normal. Cervical ICA is normal. Left carotid system: Normal. Carotid bifurcation is normal. Cervical ICA is normal. Vertebral arteries: Both vertebral artery origins are widely patent. The left vertebral artery is dominant. Both vertebral arteries appear normal through the cervical region to the foramen magnum. Skeleton: Ordinary cervical spondylosis. Other neck: No mass or lymphadenopathy. Upper chest: Normal Review of the MIP images confirms the above findings CTA HEAD FINDINGS Anterior circulation: Both internal carotid arteries are widely patent through the skull base and siphon regions. The anterior and middle cerebral vessels are normal. No large vessel occlusion. No aneurysm or vascular malformation. Posterior circulation: Both vertebral arteries are widely patent to the basilar. No basilar stenosis. Left PCA is normal. Right PCA is nearly occluded 1 cm from the origin. There does appear to be minor thready distal flow. This is most consistent with embolic occlusion. Venous sinuses: Patent and normal. Anatomic variants: None significant. 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 No other abnormal vascular finding. Focal low-density in the right occipital lobe consistent with an acute right occipital stroke. No hemorrhage or mass effect. Findings discussed with Dr. Estell HarpinZammit at 1500 hours. Electronically Signed   By: Paulina FusiMark  Shogry M.D.   On: 09/03/2020 15:02    My personal review of EKG: Rhythm NSR, Rate  75 /min, QTc 451, no Acute ST changes   Assessment & Plan:    Active Problems:   HTN  (hypertension)   Prediabetes   Acute CVA -Patient presents with headache, bilateral extremity tingling/numbness, and blurry vision, her CTA head and neck significant for embolic disease to right PCA with near occlusion, and right occipital lobe acute CVA, with no other vascular abnormalities. -We will be admitted under CVA pathway, will obtain lipid panel, hemoglobin A1c, will allow for permissive hypertension, will obtain 2D echo, monitor on telemetry and obtain MRI brain. -We will consult PT/OT/SLP. -Continue with dual antiplatelet therapy aspirin and Plavix, will await further recommendation from neurology. -If work-up is negative, likely will need event monitor as she does report some palpitation, and the fact imaging significant for embolic stroke without significant atherosclerosis   Hypertension -hold medications, allow for permissive hypertension.  Prediabetes -We will check A1c  GERD - Continue with PPI  Hypothyroidism -Continue with Synthroid.  Tobacco abuse -She was counseled, started nicotine patch    DVT Prophylaxis  Lovenox   AM Labs Ordered, also please review Full Orders  Family Communication: Admission, patients condition and plan  of care including tests being ordered have been discussed with the patient and HUSBAND AT BEDSIDE who indicate understanding and agree with the plan and Code Status.  Code Status  fULL  Likely DC to  HOME  Condition GUARDED    Consults called:   NEURO REQUESTED IN epic  Admission status: INPATIENT     Time spent in minutes : 60 MINUTES   Huey Bienenstock M.D on 09/03/2020 at 3:44 PM   [facility] - Office  [phone]

## 2020-09-03 NOTE — ED Triage Notes (Signed)
Pt presents to ED via [facility] for complaints of sudden headache, dizziness, and left eye vision lost started at 1400 yesterday. EDP made aware.

## 2020-09-03 NOTE — ED Provider Notes (Signed)
[facility] EMERGENCY DEPARTMENT Provider Note   CSN: [id number] Arrival date & time: 09/03/20  1048     History Chief Complaint  Patient presents with  . Dizziness    Jeanette Hughes is a 55 y.o. female.  Patient states that 2 PM yesterday afternoon she started having severe headache and some blurred vision in the left eye.  Patient has a history of migraines and she took her migraine medicine without help.  The history is provided by the patient and medical records. No language interpreter was used.  Dizziness Quality:  Lightheadedness Severity:  Mild Onset quality:  Sudden Timing:  Constant Progression:  Worsening Chronicity:  New Context: not when bending over   Relieved by:  Nothing Associated symptoms: headaches   Associated symptoms: no chest pain and no diarrhea        Past Medical History:  Diagnosis Date  . Diverticulitis   . Hypertension   . Thyroid disease     Patient Active Problem List   Diagnosis Date Noted  . Cholelithiasis 09/23/2015  . Heavy menstrual bleeding 09/10/2015  . Elevated TSH 07/23/2015  . Anemia, iron deficiency 07/23/2015  . Prediabetes 07/23/2015  . Vitamin D deficiency 07/23/2015  . HTN (hypertension) 07/15/2015  . Abdominal pain 07/15/2015  . Pica 07/15/2015    Past Surgical History:  Procedure Laterality Date  . CHOLECYSTECTOMY       OB History   No obstetric history on file.     Family History  Problem Relation Age of Onset  . Heart disease Mother   . Heart disease Father   . Cancer Sister     Social History   Tobacco Use  . Smoking status: Current Every Day Smoker    Packs/day: 1.00    Types: Cigarettes  . Smokeless tobacco: Never Used  Substance Use Topics  . Alcohol use: Yes  . Drug use: No    Home Medications Prior to Admission medications   Medication Sig Start Date End Date Taking? Authorizing Provider  amLODipine (NORVASC) 5 MG tablet Take 1 tablet by mouth daily. 05/08/20  Yes [provider]  clonazePAM (KLONOPIN) 0.5 MG tablet Take 0.5 mg by mouth at bedtime. 07/03/20  Yes [provider]  levothyroxine (SYNTHROID) 150 MCG tablet Take 150 mcg by mouth daily. 08/22/20  Yes [provider]  lisinopril (PRINIVIL,ZESTRIL) 10 MG tablet Take 1 tablet (10 mg total) by mouth daily. Patient taking differently: Take 40 mg by mouth daily. 07/15/15  Yes Rodolph Bong, MD  naproxen sodium (ALEVE) 220 MG tablet Take 440 mg by mouth daily as needed (migraine).   Yes [provider]  omeprazole (PRILOSEC) 10 MG capsule Take by mouth.   Yes [provider]  Vitamin D, Ergocalciferol, (DRISDOL) 50000 units CAPS capsule Take 1 capsule (50,000 Units total) by mouth every 7 (seven) days. Take for 8 total doses(weeks) 07/23/15  Yes Rodolph Bong, MD  zolpidem (AMBIEN) 10 MG tablet Take 10 mg by mouth at bedtime as needed for anxiety. Will take if she runs out of clonazepam 07/03/20  Yes [provider]  levothyroxine (SYNTHROID, LEVOTHROID) 200 MCG tablet Take 1 tablet (200 mcg total) by mouth daily before breakfast. Patient not taking: No sig reported 08/31/15   Rodolph Bong, MD  levothyroxine (SYNTHROID, LEVOTHROID) 200 MCG tablet TAKE 1 TABLET(200 MCG) BY MOUTH DAILY BEFORE BREAKFAST Patient not taking: No sig reported 03/21/16   Rodolph Bong, MD  norethindrone (ORTHO MICRONOR) 0.35 MG tablet  Take 1 tablet (0.35 mg total) by mouth daily. Patient not taking: No sig reported 09/10/15   Rodolph Bong, MD    Allergies    Latex  Review of Systems   Review of Systems  Constitutional: Negative for appetite change and fatigue.  HENT: Negative for congestion, ear discharge and sinus pressure.        Blurred vision  Eyes: Negative for discharge.  Respiratory: Negative for cough.   Cardiovascular: Negative for chest pain.  Gastrointestinal: Negative for abdominal pain and diarrhea.  Genitourinary: Negative for frequency and hematuria.   Musculoskeletal: Negative for back pain.  Skin: Negative for rash.  Neurological: Positive for dizziness and headaches. Negative for seizures.  Psychiatric/Behavioral: Negative for hallucinations.    Physical Exam Updated Vital Signs BP (!) 153/91   Pulse 63   Temp 99 F (37.2 C) (Oral)   Resp 14   Ht 5\' 3"  (1.6 m)   Wt 77.1 kg   SpO2 98%   BMI 30.11 kg/m   Physical Exam Vitals and nursing note reviewed.  Constitutional:      Appearance: She is well-developed.  HENT:     Head: Normocephalic.     Comments: Poor vision left eye.  Visual acuity done    Nose: Nose normal.  Eyes:     General: No scleral icterus.    Conjunctiva/sclera: Conjunctivae normal.  Neck:     Thyroid: No thyromegaly.  Cardiovascular:     Rate and Rhythm: Normal rate and regular rhythm.     Heart sounds: No murmur heard. No friction rub. No gallop.   Pulmonary:     Breath sounds: No stridor. No wheezing or rales.  Chest:     Chest wall: No tenderness.  Abdominal:     General: There is no distension.     Tenderness: There is no abdominal tenderness. There is no rebound.  Musculoskeletal:        General: Normal range of motion.     Cervical back: Neck supple.  Lymphadenopathy:     Cervical: No cervical adenopathy.  Skin:    Findings: No erythema or rash.  Neurological:     Mental Status: She is alert and oriented to person, place, and time.     Motor: No abnormal muscle tone.     Coordination: Coordination normal.  Psychiatric:        Behavior: Behavior normal.     ED Results / Procedures / Treatments   Labs (all labs ordered are listed, but only abnormal results are displayed) Labs Reviewed  CBC WITH DIFFERENTIAL/PLATELET - Abnormal; Notable for the following components:      Result Value   Hemoglobin 11.0 (*)    All other components within normal limits  COMPREHENSIVE METABOLIC PANEL - Abnormal; Notable for the following components:   Glucose, Bld 107 (*)    Calcium 8.6 (*)     All other components within normal limits    EKG EKG Interpretation  Date/Time:  Thursday Sep 03 2020 11:03:54 EDT Ventricular Rate:  75 PR Interval:  153 QRS Duration: 95 QT Interval:  403 QTC Calculation: 451 R Axis:   35 Text Interpretation: Sinus rhythm Probable left atrial enlargement Confirmed by [id number] [phone]) on 09/03/2020 3:05:34 PM   Radiology CT Angio Head W or Wo Contrast  Result Date: 09/03/2020 CLINICAL DATA:  Headache.  Loss of vision on the left EXAM: CT ANGIOGRAPHY HEAD AND NECK TECHNIQUE: Multidetector CT imaging of the head and neck was performed using  the standard protocol during bolus administration of intravenous contrast. Multiplanar CT image reconstructions and MIPs were obtained to evaluate the vascular anatomy. Carotid stenosis measurements (when applicable) are obtained utilizing NASCET criteria, using the distal internal carotid diameter as the denominator. CONTRAST:  109mL OMNIPAQUE IOHEXOL 350 MG/ML SOLN COMPARISON:  None. FINDINGS: CT HEAD FINDINGS Brain: Brainstem and cerebellum are normal. Question small area of low-density in the right occipital cortical brain, best seen axial image 15, which could represent a right occipital infarction. The brain otherwise appears normal. No hemorrhage, hydrocephalus, mass lesion or extra-axial collection. Vascular: No abnormal vascular finding. Skull: Normal Sinuses: Clear Orbits: Normal Review of the MIP images confirms the above findings CTA NECK FINDINGS Aortic arch: Normal Right carotid system: Normal. Carotid bifurcation is normal. Cervical ICA is normal. Left carotid system: Normal. Carotid bifurcation is normal. Cervical ICA is normal. Vertebral arteries: Both vertebral artery origins are widely patent. The left vertebral artery is dominant. Both vertebral arteries appear normal through the cervical region to the foramen magnum. Skeleton: Ordinary cervical spondylosis. Other neck: No mass or lymphadenopathy. Upper  chest: Normal Review of the MIP images confirms the above findings CTA HEAD FINDINGS Anterior circulation: Both internal carotid arteries are widely patent through the skull base and siphon regions. The anterior and middle cerebral vessels are normal. No large vessel occlusion. No aneurysm or vascular malformation. Posterior circulation: Both vertebral arteries are widely patent to the basilar. No basilar stenosis. Left PCA is normal. Right PCA is nearly occluded 1 cm from the origin. There does appear to be minor thready distal flow. This is most consistent with embolic occlusion. Venous sinuses: Patent and normal. Anatomic variants: None significant. 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 No other abnormal vascular finding. Focal low-density in the right occipital lobe consistent with an acute right occipital stroke. No hemorrhage or mass effect. Findings discussed with Dr. Estell Harpin at 1500 hours. Electronically Signed   By: Paulina Fusi M.D.   On: 09/03/2020 15:02   CT Angio Neck W and/or Wo Contrast  Result Date: 09/03/2020 CLINICAL DATA:  Headache.  Loss of vision on the left EXAM: CT ANGIOGRAPHY HEAD AND NECK TECHNIQUE: Multidetector CT imaging of the head and neck was performed using the standard protocol during bolus administration of intravenous contrast. Multiplanar CT image reconstructions and MIPs were obtained to evaluate the vascular anatomy. Carotid stenosis measurements (when applicable) are obtained utilizing NASCET criteria, using the distal internal carotid diameter as the denominator. CONTRAST:  7mL OMNIPAQUE IOHEXOL 350 MG/ML SOLN COMPARISON:  None. FINDINGS: CT HEAD FINDINGS Brain: Brainstem and cerebellum are normal. Question small area of low-density in the right occipital cortical brain, best seen axial image 15, which could represent a right occipital infarction. The brain otherwise appears normal. No  hemorrhage, hydrocephalus, mass lesion or extra-axial collection. Vascular: No abnormal vascular finding. Skull: Normal Sinuses: Clear Orbits: Normal Review of the MIP images confirms the above findings CTA NECK FINDINGS Aortic arch: Normal Right carotid system: Normal. Carotid bifurcation is normal. Cervical ICA is normal. Left carotid system: Normal. Carotid bifurcation is normal. Cervical ICA is normal. Vertebral arteries: Both vertebral artery origins are widely patent. The left vertebral artery is dominant. Both vertebral arteries appear normal through the cervical region to the foramen magnum. Skeleton: Ordinary cervical spondylosis. Other neck: No mass or lymphadenopathy. Upper chest: Normal Review of the MIP images confirms the above findings CTA HEAD FINDINGS Anterior circulation: Both  internal carotid arteries are widely patent through the skull base and siphon regions. The anterior and middle cerebral vessels are normal. No large vessel occlusion. No aneurysm or vascular malformation. Posterior circulation: Both vertebral arteries are widely patent to the basilar. No basilar stenosis. Left PCA is normal. Right PCA is nearly occluded 1 cm from the origin. There does appear to be minor thready distal flow. This is most consistent with embolic occlusion. Venous sinuses: Patent and normal. Anatomic variants: None significant. 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 No other abnormal vascular finding. Focal low-density in the right occipital lobe consistent with an acute right occipital stroke. No hemorrhage or mass effect. Findings discussed with Dr. Estell HarpinZammit at 1500 hours. Electronically Signed   By: Paulina FusiMark  Shogry M.D.   On: 09/03/2020 15:02    Procedures Procedures   Medications Ordered in ED Medications  clopidogrel (PLAVIX) tablet 75 mg (has no administration in time range)  aspirin chewable tablet 81 mg (has no  administration in time range)  prochlorperazine (COMPAZINE) injection 10 mg (10 mg Intravenous Given 09/03/20 1120)  diphenhydrAMINE (BENADRYL) injection 25 mg (25 mg Intravenous Given 09/03/20 1121)  iohexol (OMNIPAQUE) 350 MG/ML injection 75 mL (75 mLs Intravenous Contrast Given 09/03/20 1416)  aspirin chewable tablet 81 mg (81 mg Oral Given 09/03/20 1520)  clopidogrel (PLAVIX) tablet 75 mg (75 mg Oral Given 09/03/20 1521)  CRITICAL CARE Performed by: Bethann BerkshireJoseph Nahum Sherrer Total critical care time: 45minutes Critical care time was exclusive of separately billable procedures and treating other patients. Critical care was necessary to treat or prevent imminent or life-threatening deterioration. Critical care was time spent personally by me on the following activities: development of treatment plan with patient and/or surrogate as well as nursing, discussions with consultants, evaluation of patient's response to treatment, examination of patient, obtaining history from patient or surrogate, ordering and performing treatments and interventions, ordering and review of laboratory studies, ordering and review of radiographic studies, pulse oximetry and re-evaluation of patient's condition.   ED Course  I have reviewed the triage vital signs and the nursing notes.  Pertinent labs & imaging results that were available during my care of the patient were reviewed by me and considered in my medical decision making (see chart for details). CT scan shows right occipital stroke and a clot in the right posterior cerebral artery.  I spoke with neurology Dr. Selina CooleyStack and she stated that treatment for now is just aspirin and Plavix.  Patient will be admitted to medicine   MDM Rules/Calculators/A&P        Patient with a stroke from 2 PM last night.  She started on aspirin and Plavix and will be admitted to medicine with neuro consult in the hospital                   Final Clinical Impression(s) / ED Diagnoses Final  diagnoses:  None    Rx / DC Orders ED Discharge Orders    None       Bethann BerkshireZammit, Bob Daversa, MD 09/04/20 1702

## 2020-09-03 NOTE — Progress Notes (Signed)
Patient back from MRI.

## 2020-09-04 ENCOUNTER — Inpatient Hospital Stay (HOSPITAL_BASED_OUTPATIENT_CLINIC_OR_DEPARTMENT_OTHER): Payer: BC Managed Care – PPO

## 2020-09-04 DIAGNOSIS — I6389 Other cerebral infarction: Secondary | ICD-10-CM

## 2020-09-04 DIAGNOSIS — E782 Mixed hyperlipidemia: Secondary | ICD-10-CM

## 2020-09-04 DIAGNOSIS — I639 Cerebral infarction, unspecified: Secondary | ICD-10-CM | POA: Diagnosis not present

## 2020-09-04 DIAGNOSIS — E039 Hypothyroidism, unspecified: Secondary | ICD-10-CM

## 2020-09-04 DIAGNOSIS — I10 Essential (primary) hypertension: Secondary | ICD-10-CM | POA: Diagnosis not present

## 2020-09-04 LAB — ECHOCARDIOGRAM COMPLETE
Area-P 1/2: 2.9 cm2
Height: 63 in
S' Lateral: 2.01 cm
Weight: 2720 oz

## 2020-09-04 LAB — LIPID PANEL
Cholesterol: 179 mg/dL (ref 0–200)
HDL: 51 mg/dL (ref >40)
LDL Cholesterol: 75 mg/dL (ref 0–99)
Total CHOL/HDL Ratio: 3.5 RATIO
Triglycerides: 267 mg/dL — ABNORMAL HIGH (ref <150)
VLDL: 53 mg/dL — ABNORMAL HIGH (ref 0–40)

## 2020-09-04 LAB — HEMOGLOBIN A1C
Hgb A1c MFr Bld: 6.3 % — ABNORMAL HIGH (ref 4.8–5.6)
Mean Plasma Glucose: 134.11 mg/dL

## 2020-09-04 LAB — HIV ANTIBODY (ROUTINE TESTING W REFLEX): HIV Screen 4th Generation wRfx: NONREACTIVE

## 2020-09-04 LAB — SARS CORONAVIRUS 2 (TAT 6-24 HRS): SARS Coronavirus 2: NEGATIVE

## 2020-09-04 MED ORDER — EZETIMIBE 10 MG PO TABS
10.0000 mg | ORAL_TABLET | Freq: Every day | ORAL | Status: DC
  Administered 2020-09-04: 10 mg via ORAL
  Filled 2020-09-04: qty 1

## 2020-09-04 MED ORDER — ASPIRIN 81 MG PO TBEC: 81.0000 mg | DELAYED_RELEASE_TABLET | Freq: Every day | ORAL | 11 refills | Status: AC

## 2020-09-04 MED ORDER — NICOTINE 21 MG/24HR TD PT24: 21.0000 mg | MEDICATED_PATCH | Freq: Every day | TRANSDERMAL | 0 refills | Status: AC

## 2020-09-04 MED ORDER — PRAVASTATIN SODIUM 40 MG PO TABS: 40.0000 mg | ORAL_TABLET | Freq: Every evening | ORAL | 11 refills | Status: AC

## 2020-09-04 MED ORDER — EZETIMIBE 10 MG PO TABS: 10.0000 mg | ORAL_TABLET | Freq: Every day | ORAL | 3 refills | Status: AC

## 2020-09-04 MED ORDER — CLOPIDOGREL BISULFATE 75 MG PO TABS: 75.0000 mg | ORAL_TABLET | Freq: Every day | ORAL | 2 refills | Status: AC

## 2020-09-04 NOTE — Discharge Summary (Signed)
Physician Discharge Summary  Jeanette Hughes KGY:[id number] DOB: 21-Jun-1965 DOA: 09/03/2020  PCP: Medicine, [facility]  Admit date: 09/03/2020 Discharge date: 09/04/2020  Time spent: 35 minutes  Recommendations for Outpatient Follow-up:  1. Repeat basic metabolic panel to follow electrolytes and renal function 2. Reassess BP and further adjust antihypertensive regimen as needed 3. Follow patient CBGs with initiation of hypoglycemia regimen as required. 4. Continue assisting patient with tobacco cessation  5. Follow LFT's and lipid panel in 6-8 weeks.   Discharge Diagnoses:  Active Problems:   HTN (hypertension)   Prediabetes   Acute CVA (cerebrovascular accident) (HCC)   Mixed hyperlipidemia   Hypothyroidism   Discharge Condition: stable and improved. Discharged home with instructions to follow-up with PCP and neurology as an outpatient.  CODE STATUS: Full code.  Diet recommendation: Heart healthy and modify carbohydrates.  Filed Weights   09/03/20 1101  Weight: 77.1 kg    History of present illness:  As per H&P written by Dr. Randol Kern on 09/03/2020 Jeanette Hughes  is a 55 y.o. female, with medical history of hypertension, tobacco abuse, prediabetes, GERD, she presents to ED secondary to complaints of blurry vision, headache, nausea, patient reports her symptoms started yesterday at 2 PM, all of a sudden, she thought it is related to migraine headache, she took migraine medicine without help, she reports some tingling numbness in bilateral upper extremities as well, resolved by now, and as symptoms did not improve by today, her husband brought her to ED, she denies any worsening symptoms, as well she denies no improvement of her symptoms either, she smokes 1 pack/day for last 30 years, she does report intermittent feeling of palpitation with exertion, but she denies any dyspnea, chest pain, fever or chills. - in ED AAA head and neck significant for embolic near  occlusion right PCA, with acute right occipital CVA, there is no evidence of large vessel occlusion, no evidence of A. fib, (as well no history of A. fib), blood pressure overall acceptable in ED systolic 130s-150s), ED physician discussed with neurology on-call Dr. Darlyn Read, who recommended aspirin and Plavix and admission for further work-up, as well have discussed with neuro interventional call at [facility], at this point no indication for intervention given stable symptoms, and more than 24 hours of her symptoms, [facility] consulted to admit.  Hospital Course:  1-right PCA CVA -Out of therapeutic window for any intervention -MRI of the brain demonstrating same distribution of abnormalities seen on CT scan. -Case discussed with neurology service who has recommended the use of aspirin, Plavix, Zetia and Pravachol for secondary prevention -2D echo without thrombi or any other acute abnormality; normal ejection fraction -Continue controlling blood pressure and stop tobacco abuse. -Outpatient follow-up with Dr. Gerilyn Pilgrim in 1 month -Outpatient event monitoring for 30 days will be arranged.  2-essential hypertension -Safe to resume antihypertensive agents -Follow blood pressure and adjust as needed based on completion -Diet healthy diet recommended.  3-mixed hyperlipidemia -Elevated triglycerides and LDL of 75 seen on lipid panel -Zetia and Pravachol daily has been started.  4-gastroesophageal reflux disease -Continue PPI  5-hypothyroidism -Continue Synthroid.  6-tobacco abuse -Cessation counseling has been provided -Nicotine patch use encouraged  7-Prediabetes -A1C 6.3 -Modify carbohydrate diet has been encouraged -Close CBGs monitoring with initiation of hypoglycemic regimen in the near future if needed recommended.   Procedures:  See below for x-ray reports  2D echo: 1. Left ventricular ejection fraction, by estimation, is 65 to 70%. The  left ventricle has  normal  function. The left ventricle has no regional  wall motion abnormalities. There is moderate asymmetric left ventricular  hypertrophy of the septal segment.  Left ventricular diastolic parameters are indeterminate.  2. Right ventricular systolic function is normal. The right ventricular  size is normal. There is normal pulmonary artery systolic pressure. The  estimated right ventricular systolic pressure is 19.3 mmHg.  3. There is a trivial pericardial effusion posterior to the left  ventricle.  4. The mitral valve is grossly normal. Trivial mitral valve  regurgitation.  5. The aortic valve is tricuspid. Aortic valve regurgitation is not  visualized.  6. The inferior vena cava is normal in size with greater than 50%  respiratory variability, suggesting right atrial pressure of 3 mmHg.   Consultations:  Neurology (Dr. Gerilyn Pilgrim).  Discharge Exam: Vitals:   09/04/20 0737 09/04/20 1147  BP: (!) 156/90 (!) 179/86  Pulse: 80 73  Resp: 18 18  Temp: 98.4 F (36.9 C) 98.1 F (36.7 C)  SpO2: 100% 100%    General: Still complaining of some visual disturbances on her left side; No other focal neurologic deficits.  Mild headache reported.  Feeling ready to go home. Cardiovascular: Sinus rhythm, regular rate, no rubs, no gallops, no murmurs on exam.  No JVD Respiratory: Clear to auscultation bilaterally; no using accessory muscle.  Good oxygen saturation on room air Abdomen: Soft, nontender, nondistended, positive bowel sounds Extremities: No cyanosis or clubbing.  Discharge Instructions   Discharge Instructions    Diet - low sodium heart healthy   Complete by: As directed    Discharge instructions   Complete by: As directed    Take medications as prescribed Follow up with neurology service as instructed Follow heart healthy diet Take aspirin and plavix for secondary stroke prevention.   Increase activity slowly   Complete by: As directed      Allergies as of 09/04/2020       Reactions   Latex Rash   Burning skin      Medication List    STOP taking these medications   norethindrone 0.35 MG tablet Commonly known as: Ortho Micronor     TAKE these medications   amLODipine 5 MG tablet Commonly known as: NORVASC Take 1 tablet by mouth daily.   aspirin 81 MG EC tablet Take 1 tablet (81 mg total) by mouth daily. Swallow whole. Start taking on: Sep 05, 2020   clonazePAM 0.5 MG tablet Commonly known as: KLONOPIN Take 0.5 mg by mouth at bedtime.   clopidogrel 75 MG tablet Commonly known as: PLAVIX Take 1 tablet (75 mg total) by mouth daily. Start taking on: Sep 05, 2020   ezetimibe 10 MG tablet Commonly known as: ZETIA Take 1 tablet (10 mg total) by mouth daily. Start taking on: Sep 05, 2020   levothyroxine 150 MCG tablet Commonly known as: SYNTHROID Take 150 mcg by mouth daily. What changed: Another medication with the same name was removed. Continue taking this medication, and follow the directions you see here.   lisinopril 10 MG tablet Commonly known as: ZESTRIL Take 1 tablet (10 mg total) by mouth daily. What changed: how much to take   naproxen sodium 220 MG tablet Commonly known as: ALEVE Take 440 mg by mouth daily as needed (migraine).   nicotine 21 mg/24hr patch Commonly known as: NICODERM CQ - dosed in mg/24 hours Place 1 patch (21 mg total) onto the skin daily. Start taking on: Sep 05, 2020   omeprazole 10 MG capsule  Commonly known as: PRILOSEC Take by mouth.   pravastatin 40 MG tablet Commonly known as: Pravachol Take 1 tablet (40 mg total) by mouth every evening.   Vitamin D (Ergocalciferol) 1.25 MG (50000 UNIT) Caps capsule Commonly known as: DRISDOL Take 1 capsule (50,000 Units total) by mouth every 7 (seven) days. Take for 8 total doses(weeks)   zolpidem 10 MG tablet Commonly known as: AMBIEN Take 10 mg by mouth at bedtime as needed for anxiety. Will take if she runs out of clonazepam      Allergies  Allergen  Reactions  . Latex Rash    Burning skin    Follow-up Information    Medicine, [facility]. Schedule an appointment as soon as possible for a visit in 10 day(s).   Specialty: Family Medicine       Beryle Beams, MD. Schedule an appointment as soon as possible for a visit in 1 month(s).   Specialty: Neurology Contact information: [address] [place] [address] [phone]               The results of significant diagnostics from this hospitalization (including imaging, microbiology, ancillary and laboratory) are listed below for reference.    Significant Diagnostic Studies: CT Angio Head W or Wo Contrast  Result Date: 09/03/2020 CLINICAL DATA:  Headache.  Loss of vision on the left EXAM: CT ANGIOGRAPHY HEAD AND NECK TECHNIQUE: Multidetector CT imaging of the head and neck was performed using the standard protocol during bolus administration of intravenous contrast. Multiplanar CT image reconstructions and MIPs were obtained to evaluate the vascular anatomy. Carotid stenosis measurements (when applicable) are obtained utilizing NASCET criteria, using the distal internal carotid diameter as the denominator. CONTRAST:  58mL OMNIPAQUE IOHEXOL 350 MG/ML SOLN COMPARISON:  None. FINDINGS: CT HEAD FINDINGS Brain: Brainstem and cerebellum are normal. Question small area of low-density in the right occipital cortical brain, best seen axial image 15, which could represent a right occipital infarction. The brain otherwise appears normal. No hemorrhage, hydrocephalus, mass lesion or extra-axial collection. Vascular: No abnormal vascular finding. Skull: Normal Sinuses: Clear Orbits: Normal Review of the MIP images confirms the above findings CTA NECK FINDINGS Aortic arch: Normal Right carotid system: Normal. Carotid bifurcation is normal. Cervical ICA is normal. Left carotid system: Normal. Carotid bifurcation is normal. Cervical ICA is normal. Vertebral arteries: Both  vertebral artery origins are widely patent. The left vertebral artery is dominant. Both vertebral arteries appear normal through the cervical region to the foramen magnum. Skeleton: Ordinary cervical spondylosis. Other neck: No mass or lymphadenopathy. Upper chest: Normal Review of the MIP images confirms the above findings CTA HEAD FINDINGS Anterior circulation: Both internal carotid arteries are widely patent through the skull base and siphon regions. The anterior and middle cerebral vessels are normal. No large vessel occlusion. No aneurysm or vascular malformation. Posterior circulation: Both vertebral arteries are widely patent to the basilar. No basilar stenosis. Left PCA is normal. Right PCA is nearly occluded 1 cm from the origin. There does appear to be minor thready distal flow. This is most consistent with embolic occlusion. Venous sinuses: Patent and normal. Anatomic variants: None significant. 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 No other abnormal vascular finding. Focal low-density in the right occipital lobe consistent with an acute right occipital stroke. No hemorrhage or mass effect. Findings discussed with Dr. Estell Harpin at 1500 hours. Electronically Signed   By: Paulina Fusi  M.D.   On: 09/03/2020 15:02   CT Angio Neck W and/or Wo Contrast  Result Date: 09/03/2020 CLINICAL DATA:  Headache.  Loss of vision on the left EXAM: CT ANGIOGRAPHY HEAD AND NECK TECHNIQUE: Multidetector CT imaging of the head and neck was performed using the standard protocol during bolus administration of intravenous contrast. Multiplanar CT image reconstructions and MIPs were obtained to evaluate the vascular anatomy. Carotid stenosis measurements (when applicable) are obtained utilizing NASCET criteria, using the distal internal carotid diameter as the denominator. CONTRAST:  75mL OMNIPAQUE IOHEXOL 350 MG/ML SOLN COMPARISON:  None. FINDINGS:  CT HEAD FINDINGS Brain: Brainstem and cerebellum are normal. Question small area of low-density in the right occipital cortical brain, best seen axial image 15, which could represent a right occipital infarction. The brain otherwise appears normal. No hemorrhage, hydrocephalus, mass lesion or extra-axial collection. Vascular: No abnormal vascular finding. Skull: Normal Sinuses: Clear Orbits: Normal Review of the MIP images confirms the above findings CTA NECK FINDINGS Aortic arch: Normal Right carotid system: Normal. Carotid bifurcation is normal. Cervical ICA is normal. Left carotid system: Normal. Carotid bifurcation is normal. Cervical ICA is normal. Vertebral arteries: Both vertebral artery origins are widely patent. The left vertebral artery is dominant. Both vertebral arteries appear normal through the cervical region to the foramen magnum. Skeleton: Ordinary cervical spondylosis. Other neck: No mass or lymphadenopathy. Upper chest: Normal Review of the MIP images confirms the above findings CTA HEAD FINDINGS Anterior circulation: Both internal carotid arteries are widely patent through the skull base and siphon regions. The anterior and middle cerebral vessels are normal. No large vessel occlusion. No aneurysm or vascular malformation. Posterior circulation: Both vertebral arteries are widely patent to the basilar. No basilar stenosis. Left PCA is normal. Right PCA is nearly occluded 1 cm from the origin. There does appear to be minor thready distal flow. This is most consistent with embolic occlusion. Venous sinuses: Patent and normal. Anatomic variants: None significant. 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 No other abnormal vascular finding. Focal low-density in the right occipital lobe consistent with an acute right occipital stroke. No hemorrhage or mass effect. Findings discussed with Dr. Estell HarpinZammit at 1500 hours.  Electronically Signed   By: Paulina FusiMark  Shogry M.D.   On: 09/03/2020 15:02   MR BRAIN WO CONTRAST  Result Date: 09/03/2020 CLINICAL DATA:  Blurred vision, headache and nausea beginning yesterday. EXAM: MRI HEAD WITHOUT CONTRAST TECHNIQUE: Multiplanar, multiecho pulse sequences of the brain and surrounding structures were obtained without intravenous contrast. COMPARISON:  CT studies same day. FINDINGS: Brain: Diffusion imaging shows areas of patchy acute infarction in the right occipital lobe. Acute infarction also affecting the right side of the splenium of the corpus callosum. Small punctate acute infarction within the right lateral thalamus. Findings consistent with right PCA insult. No other acute infarction. The brain otherwise has a normal appearance. No mass, hemorrhage, hydrocephalus or extra-axial collection. Vascular: Major vessels at the base of the brain show flow. Skull and upper cervical spine: Negative Sinuses/Orbits: Clear/normal Other: None IMPRESSION: Areas of acute infarction in the right PCA territory consistent with the previous CT findings. No evidence of mass effect or hemorrhage. Numerous patchy infarctions in the right occipital lobe. Involvement also of the right side of the splenium of the corpus callosum. Punctate involvement of the right lateral thalamus. Electronically Signed   By: Paulina FusiMark  Shogry M.D.   On: 09/03/2020 19:22   ECHOCARDIOGRAM  COMPLETE  Result Date: 09/04/2020    ECHOCARDIOGRAM REPORT   Patient Name:   SHARMEKA PALMISANO Date of Exam: 09/04/2020 Medical Rec #:  [id number]     Height:       63.0 in Accession #:    [id number]    Weight:       170.0 lb Date of Birth:  26-Nov-1965     BSA:          1.805 m Patient Age:    55 years      BP:           179/86 mmHg Patient Gender: F             HR:           73 bpm. [facility] Procedure: 2D Echo Indications:    Stroke I63.9  History:        Patient has no prior history of Echocardiogram examinations.                  Stroke; Risk Factors:Hypertension and Current Smoker.                 Prediabetes.  Sonographer:    Jeryl Columbia RDCS (AE) Referring Phys: [id number] [address] EL[address] IMPRESSIONS  1. Left ventricular ejection fraction, by estimation, is 65 to 70%. The left ventricle has normal function. The left ventricle has no regional wall motion abnormalities. There is moderate asymmetric left ventricular hypertrophy of the septal segment. Left ventricular diastolic parameters are indeterminate.  2. Right ventricular systolic function is normal. The right ventricular size is normal. There is normal pulmonary artery systolic pressure. The estimated right ventricular systolic pressure is 19.3 mmHg.  3. There is a trivial pericardial effusion posterior to the left ventricle.  4. The mitral valve is grossly normal. Trivial mitral valve regurgitation.  5. The aortic valve is tricuspid. Aortic valve regurgitation is not visualized.  6. The inferior vena cava is normal in size with greater than 50% respiratory variability, suggesting right atrial pressure of 3 mmHg. FINDINGS  Left Ventricle: Left ventricular ejection fraction, by estimation, is 65 to 70%. The left ventricle has normal function. The left ventricle has no regional wall motion abnormalities. The left ventricular internal cavity size was normal in size. There is  moderate asymmetric left ventricular hypertrophy of the septal segment. Left ventricular diastolic parameters are indeterminate. Right Ventricle: The right ventricular size is normal. No increase in right ventricular wall thickness. Right ventricular systolic function is normal. There is normal pulmonary artery systolic pressure. The tricuspid regurgitant velocity is 2.02 m/s, and  with an assumed right atrial pressure of 3 mmHg, the estimated right ventricular systolic pressure is 19.3 mmHg. Left Atrium: Left atrial size was normal in size. Right Atrium: Right atrial size was normal in size. Pericardium: Trivial  pericardial effusion is present. The pericardial effusion is posterior to the left ventricle. Mitral Valve: The mitral valve is grossly normal. Trivial mitral valve regurgitation. Tricuspid Valve: The tricuspid valve is grossly normal. Tricuspid valve regurgitation is trivial. Aortic Valve: The aortic valve is tricuspid. There is mild aortic valve annular calcification. Aortic valve regurgitation is not visualized. Pulmonic Valve: The pulmonic valve was grossly normal. Pulmonic valve regurgitation is trivial. Aorta: The aortic root is normal in size and structure. Venous: The inferior vena cava is normal in size with greater than 50% respiratory variability, suggesting right atrial pressure of 3 mmHg. IAS/Shunts: No atrial level shunt detected by color flow Doppler.  LEFT VENTRICLE PLAX 2D LVIDd:         3.27 cm  Diastology LVIDs:         2.01 cm  LV e' medial:    5.77 cm/s LV PW:         1.24 cm  LV E/e' medial:  13.5 LV IVS:        1.45 cm  LV e' lateral:   5.87 cm/s LVOT diam:     2.00 cm  LV E/e' lateral: 13.2 LVOT Area:     3.14 cm  RIGHT VENTRICLE RV S prime:     13.30 cm/s TAPSE (M-mode): 2.0 cm LEFT ATRIUM             Index       RIGHT ATRIUM           Index LA diam:        3.20 cm 1.77 cm/m  RA Area:     10.90 cm LA Vol (A2C):   57.6 ml 31.92 ml/m RA Volume:   23.70 ml  13.13 ml/m LA Vol (A4C):   27.6 ml 15.29 ml/m LA Biplane Vol: 41.0 ml 22.72 ml/m   AORTA Ao Root diam: 2.60 cm MITRAL VALVE                TRICUSPID VALVE MV Area (PHT): 2.90 cm     TR Peak grad:   16.3 mmHg MV Decel Time: 262 msec     TR Vmax:        202.00 cm/s MV E velocity: 77.70 cm/s MV A velocity: 125.00 cm/s  SHUNTS MV E/A ratio:  0.62         Systemic Diam: 2.00 cm Nona Dell MD Electronically signed by Nona Dell MD Signature Date/Time: 09/04/2020/4:06:35 PM    Final    Microbiology: Recent Results (from the past 240 hour(s))  SARS CORONAVIRUS 2 (TAT 6-24 HRS) Nasopharyngeal Nasopharyngeal Swab     Status: None    Collection Time: 09/03/20  4:00 PM   Specimen: Nasopharyngeal Swab  Result Value Ref Range Status   SARS Coronavirus 2 NEGATIVE NEGATIVE Final    Comment: (NOTE) SARS-CoV-2 target nucleic acids are NOT DETECTED.  The SARS-CoV-2 RNA is generally detectable in upper and lower respiratory specimens during the acute phase of infection. Negative results do not preclude SARS-CoV-2 infection, do not rule out co-infections with other pathogens, and should not be used as the sole basis for treatment or other patient management decisions. Negative results must be combined with clinical observations, patient history, and epidemiological information. The expected result is Negative.  Fact Sheet for Patients: HairSlick.no  Fact Sheet for Healthcare Providers: quierodirigir.com  This test is not yet approved or cleared by the [place] FDA and  has been authorized for detection and/or diagnosis of SARS-CoV-2 by FDA under an Emergency Use Authorization (EUA). This EUA will remain  in effect (meaning this test can be used) for the duration of the COVID-19 declaration under Se ction 564(b)(1) of the Act, 21 U.S.C. section 360bbb-3(b)(1), unless the authorization is terminated or revoked sooner.  Performed at [facility], [address] [place], [place] [address]      Labs: Basic Metabolic Panel: Recent Labs  Lab 09/03/20 1110  NA 136  K 4.3  CL 105  CO2 24  GLUCOSE 107*  BUN 14  CREATININE 0.73  CALCIUM 8.6*   Liver Function Tests: Recent Labs  Lab 09/03/20 1110  AST 18  ALT 20  ALKPHOS 69  BILITOT 0.3  PROT 7.2  ALBUMIN 3.7   CBC: Recent Labs  Lab 09/03/20 1110  WBC 8.7  NEUTROABS 5.6  HGB 11.0*  HCT 36.0  MCV 86.3  PLT 338    Signed:  Vassie Loll MD.  [facility] 09/04/2020, 5:17 PM

## 2020-09-04 NOTE — Progress Notes (Signed)
2D echo completed.   Jarah Pember, RDCS 

## 2020-09-04 NOTE — Progress Notes (Signed)
PIV out, ride waiting downstairs. Out with nurse tech via wheelchair at 1930.

## 2020-09-04 NOTE — Evaluation (Signed)
Physical Therapy Evaluation Patient Details Name: Jeanette Hughes MRN: [id number] DOB: [birth year] Today's Date: 09/04/2020   History of Present Illness  Jeanette Hughes  is a 55 y.o. female, with medical history of hypertension, tobacco abuse, prediabetes, GERD, she presents to ED secondary to complaints of blurry vision, headache, nausea, patient reports her symptoms started yesterday at 2 PM, all of a sudden, she thought it is related to migraine headache, she took migraine medicine without help, she reports some tingling numbness in bilateral upper extremities as well, resolved by now, and as symptoms did not improve by today, her husband brought her to ED, she denies any worsening symptoms, as well she denies no improvement of her symptoms either, she smokes 1 pack/day for last 30 years, she does report intermittent feeling of palpitation with exertion, but she denies any dyspnea, chest pain, fever or chills.  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 fib, (as well no history of A. fib), blood pressure overall acceptable in ED systolic 130s-150s), ED physician discussed with neurology on-call Dr. Darlyn Read, who recommended aspirin and Plavix and admission for further work-up, as well have discussed with neuro interventional call at [facility], at this point no indication for intervention given stable symptoms, and more than 24 hours of her symptoms, [facility] hospitalist consulted to admit.    Clinical Impression  Patient functioning at baseline for functional mobility and gait and is independent with all mobility. Patient does not require assist and is safe with ambulation without AD. Patient does not require any additional physical therapy services at this time. Patient discharged to care of nursing for ambulation daily as tolerated for length of stay.      Follow Up  Recommendations No PT follow up    Equipment Recommendations  None recommended by PT    Recommendations for Other Services       Precautions / Restrictions Precautions Precautions: None Restrictions Weight Bearing Restrictions: No      Mobility  Bed Mobility Overal bed mobility: Independent                  Transfers Overall transfer level: Independent                  Ambulation/Gait Ambulation/Gait assistance: Independent Gait Distance (Feet): 120 Feet Assistive device: None Gait Pattern/deviations: WFL(Within Functional Limits)        Stairs            Wheelchair Mobility    Modified Rankin (Stroke Patients Only)       Balance Overall balance assessment: Independent                                           Pertinent Vitals/Pain Pain Assessment: 0-10 Pain Score: 6  Pain Location: head Pain Descriptors / Indicators: Headache Pain Intervention(s): Monitored during session    Home Living Family/patient expects to be discharged to:: Private residence Living Arrangements: Spouse/significant other Available Help at Discharge: Available PRN/intermittently;Family Type of Home: House Home Access: Stairs to enter Entrance Stairs-Rails: Can reach both Entrance Stairs-Number of Steps: 3 Home Layout: One level Home Equipment: Cane - single point;Bedside commode;Shower seat;Grab bars - tub/shower      Prior Function Level of Independence: Independent  Comments: Indepenent ADL and IADL per pt report. Pt drove.     Hand Dominance   Dominant Hand: Right    Extremity/Trunk Assessment   Upper Extremity Assessment Upper Extremity Assessment: Defer to OT evaluation    Lower Extremity Assessment Lower Extremity Assessment: Overall WFL for tasks assessed    Cervical / Trunk Assessment Cervical / Trunk Assessment: Normal  Communication   Communication: No difficulties  Cognition Arousal/Alertness:  Awake/alert Behavior During Therapy: WFL for tasks assessed/performed Overall Cognitive Status: Within Functional Limits for tasks assessed                                        General Comments      Exercises     Assessment/Plan    PT Assessment Patent does not need any further PT services  PT Problem List         PT Treatment Interventions      PT Goals (Current goals can be found in the Care Plan section)  Acute Rehab PT Goals Patient Stated Goal: return home PT Goal Formulation: With patient Time For Goal Achievement: 09/04/20 Potential to Achieve Goals: Good    Frequency     Barriers to discharge        Co-evaluation               AM-PAC PT "6 Clicks" Mobility  Outcome Measure Help needed turning from your back to your side while in a flat bed without using bedrails?: None Help needed moving from lying on your back to sitting on the side of a flat bed without using bedrails?: None Help needed moving to and from a bed to a chair (including a wheelchair)?: None Help needed standing up from a chair using your arms (e.g., wheelchair or bedside chair)?: None Help needed to walk in hospital room?: None Help needed climbing 3-5 steps with a railing? : None 6 Click Score: 24    End of Session   Activity Tolerance: Patient tolerated treatment well Patient left: in bed;with call bell/phone within reach Nurse Communication: Mobility status PT Visit Diagnosis: Other symptoms and signs involving the nervous system (P71.062)    Time: 6948-5462 PT Time Calculation (min) (ACUTE ONLY): 6 min   Charges:   PT Evaluation $PT Eval Low Complexity: 1 Low          11:10 AM, 09/04/20 Wyman Songster PT, DPT Physical Therapist at [facility]

## 2020-09-04 NOTE — Evaluation (Signed)
Occupational Therapy Evaluation Patient Details Name: Jeanette Hughes MRN: [id number] DOB: 1965/07/21 Today's Date: 09/04/2020    History of Present Illness Jeanette Hughes  is a 55 y.o. female, with medical history of hypertension, tobacco abuse, prediabetes, GERD, she presents to ED secondary to complaints of blurry vision, headache, nausea, patient reports her symptoms started yesterday at 2 PM, all of a sudden, she thought it is related to migraine headache, she took migraine medicine without help, she reports some tingling numbness in bilateral upper extremities as well, resolved by now, and as symptoms did not improve by today, her husband brought her to ED, she denies any worsening symptoms, as well she denies no improvement of her symptoms either, she smokes 1 pack/day for last 30 years, she does report intermittent feeling of palpitation with exertion, but she denies any dyspnea, chest pain, fever or chills.  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 fib, (as well no history of A. fib), blood pressure overall acceptable in ED systolic 130s-150s), ED physician discussed with neurology on-call Dr. Darlyn Read, who recommended aspirin and Plavix and admission for further work-up, as well have discussed with neuro interventional call at [facility], at this point no indication for intervention given stable symptoms, and more than 24 hours of her symptoms, [facility] hospitalist consulted to admit.   Clinical Impression   Pt pleasant and agreeable to OT evaluation. Pt able to don sock, transfer from bed to chair, and ambulate in the hall ~50 + feet with independence. Pt primary deficit at this time is blurred vision at all distances. Vision reportedly remains constantly blurred despite near or far. Pt demonstrates smooth tracking in all fields of vision but does have mild difficulty with  convergence due to L eye staying medial rather than converging. Pt reported dizziness when focusing vision for the assessment tasks. Pt is not recommended for further acute OT services but likely would benefit from a referral to a vision specialist. Pt will be discharged to care of nursing staff for the remainder of her stay.     Follow Up Recommendations  No OT follow up    Equipment Recommendations  None recommended by OT    Recommendations for Other Services Other (comment) (Recommend pt sees a vision specialist.)     Precautions / Restrictions Precautions Precautions: None      Mobility Bed Mobility Overal bed mobility: Independent                  Transfers Overall transfer level: Independent                    Balance Overall balance assessment: Independent                                         ADL either performed or assessed with clinical judgement   ADL Overall ADL's : Independent                                       General ADL Comments: Able to don socks with independence seated in chair.     Vision Baseline Vision/History: Wears glasses Wears Glasses:  (reading glasses ; night time  driving glasses) Patient Visual Report: Blurring of vision (Pt reports inabilty to read items near or far; blurred vission remains consistent despite distance.) Vision Assessment?: Yes Eye Alignment: Within Functional Limits Tracking/Visual Pursuits: Able to track stimulus in all quads without difficulty Convergence: Impaired (comment) (Noted L eye inability to converge.) Additional Comments: Pt reported increased dizziness with vission assessment.                Pertinent Vitals/Pain Pain Assessment: 0-10 Pain Score: 3  Pain Location: head Pain Descriptors / Indicators: Headache Pain Intervention(s): Monitored during session     Hand Dominance Right   Extremity/Trunk Assessment Upper Extremity Assessment Upper  Extremity Assessment: Overall WFL for tasks assessed   Lower Extremity Assessment Lower Extremity Assessment: Defer to PT evaluation   Cervical / Trunk Assessment Cervical / Trunk Assessment: Normal   Communication Communication Communication: No difficulties   Cognition Arousal/Alertness: Awake/alert Behavior During Therapy: WFL for tasks assessed/performed Overall Cognitive Status: Within Functional Limits for tasks assessed                                                      Home Living Family/patient expects to be discharged to:: Private residence Living Arrangements: Spouse/significant other Available Help at Discharge: Available PRN/intermittently;Family Type of Home: House Home Access: Stairs to enter [employer] of Steps: 3 Entrance Stairs-Rails: Can reach both Home Layout: One level     Bathroom Shower/Tub: Producer, television/film/video: Standard Bathroom Accessibility: Yes How Accessible: Accessible via walker Home Equipment: Cane - single point;Bedside commode;Shower seat;Grab bars - tub/shower          Prior Functioning/Environment Level of Independence: Independent        Comments: Indepenent ADL and IADL per pt report. Pt drove.                      OT Goals(Current goals can be found in the care plan section) Acute Rehab OT Goals Patient Stated Goal: return home  OT Frequency:      End of Session    Activity Tolerance: Patient tolerated treatment well Patient left: in chair;with call bell/phone within reach  OT Visit Diagnosis: Low vision, both eyes (H54.2)                Time: 1610-9604 OT Time Calculation (min): 12 min Charges:  OT General Charges $OT Visit: 1 Visit OT Evaluation $OT Eval Low Complexity: 1 Low  Eoghan Belcher OT, MOT   Danie Chandler 09/04/2020, 8:57 AM

## 2020-09-04 NOTE — Progress Notes (Signed)
SLP Cancellation Note  Patient Details Name: Jeanette Hughes MRN: [id number] DOB: 27-Sep-1965   Cancelled treatment:       Reason Eval/Treat Not Completed: SLP screened, no needs identified, will sign off. Pt's cognition, speech and language are functioning at baseline. Thank you for this referral,  Gerlene Glassburn H. Romie Levee, CCC-SLP Speech Language Pathologist    Georgetta Haber 09/04/2020, 3:28 PM

## 2020-09-07 ENCOUNTER — Telehealth: Payer: Self-pay

## 2020-09-07 NOTE — Telephone Encounter (Signed)
Request from Dr.Madera for 30 day event monitor for CVA. Enrolled in [employer] to mail to patients home.

## 2020-09-17 DIAGNOSIS — I639 Cerebral infarction, unspecified: Secondary | ICD-10-CM | POA: Diagnosis not present

## 2020-09-21 ENCOUNTER — Telehealth: Payer: Self-pay | Admitting: Physician Assistant

## 2020-09-21 NOTE — Telephone Encounter (Signed)
It appears that the cardiac monitor was ordered by Dr. Gwenlyn Perking after hospital evaluation for stroke and that the patient is to follow-up with Dr. Gerilyn Pilgrim for neurology assessment as an outpatient.  We will make sure that these results are forwarded appropriately to her neurologist when available.

## 2020-09-21 NOTE — Telephone Encounter (Signed)
   [employer] called the answering service after-hours today. We have not seen this patient before but event monitor was ordered due to h/o CVA. [employer] has Dr. Diona Browner listed for notifying/ordering provider.  [employer] received notice that patient manually pressed notification for episode of syncope passing out. Rhythm was NSR/ST 110-112bpm at the time. They were unable to reach patient to verify. I spoke with patient who confirmed she is fine and this was an accidental press. Will route to Dr. Diona Browner in case he gets faxed notification @ his office.  Laurann Montana, PA-C

## 2020-09-28 ENCOUNTER — Other Ambulatory Visit: Payer: Self-pay

## 2020-09-28 ENCOUNTER — Ambulatory Visit (INDEPENDENT_AMBULATORY_CARE_PROVIDER_SITE_OTHER): Payer: BC Managed Care – PPO

## 2020-09-28 DIAGNOSIS — I639 Cerebral infarction, unspecified: Secondary | ICD-10-CM

## 2021-05-18 ENCOUNTER — Encounter (HOSPITAL_BASED_OUTPATIENT_CLINIC_OR_DEPARTMENT_OTHER): Payer: Self-pay

## 2021-05-18 ENCOUNTER — Ambulatory Visit (HOSPITAL_BASED_OUTPATIENT_CLINIC_OR_DEPARTMENT_OTHER): Admit: 2021-05-18 | Payer: BC Managed Care – PPO | Admitting: Obstetrics and Gynecology

## 2021-05-18 SURGERY — DILATATION AND CURETTAGE /HYSTEROSCOPY
Anesthesia: Choice

## 2021-05-26 IMAGING — CT CT ANGIO NECK
1 of 11 series · 5 of 33 positions shown · IV contrast (Omnipaque or Isovue)
Comparison: None.

CLINICAL DATA: Headache.  Loss of vision on the left

EXAM:
CT ANGIOGRAPHY HEAD AND NECK
TECHNIQUE: Multidetector CT imaging of the head and neck was performed using
the standard protocol during bolus administration of intravenous
contrast. Multiplanar CT image reconstructions and MIPs were
obtained to evaluate the vascular anatomy. Carotid stenosis
measurements (when applicable) are obtained utilizing NASCET
criteria, using the distal internal carotid diameter as the
denominator.
CONTRAST:  75mL OMNIPAQUE IOHEXOL 350 MG/ML SOLN

[Series 12: ax thins · axial · 0.39mm/px · z∈[-71,+138]mm · 5 of 315 slices shown]
[im 53/315  soft-tissue]
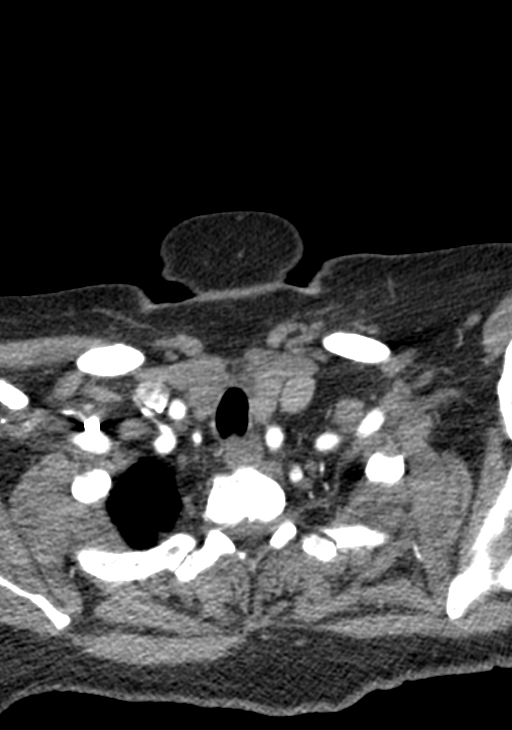
[im 105/315  bone]
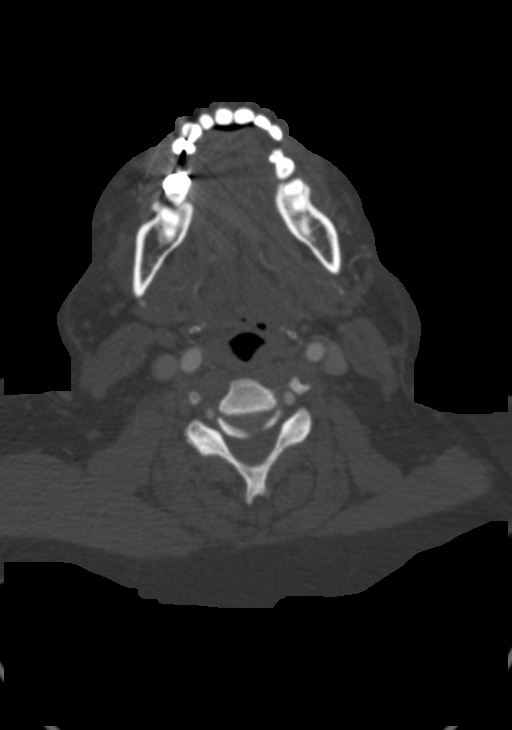
[im 158/315  soft-tissue]
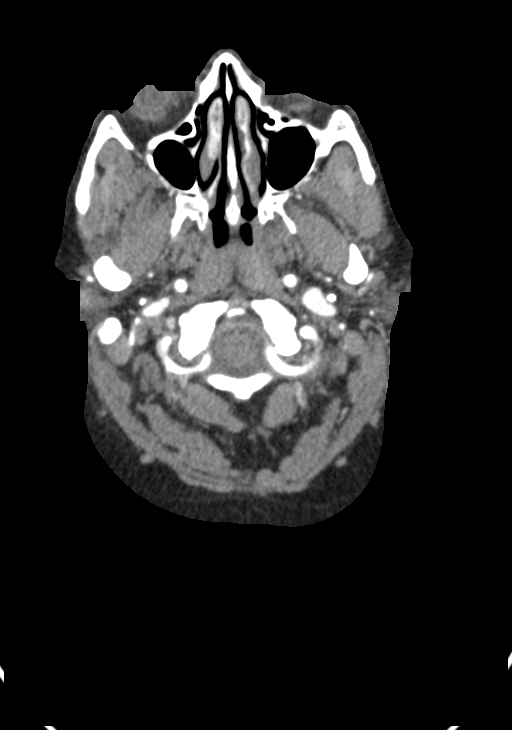
[im 210/315  bone]
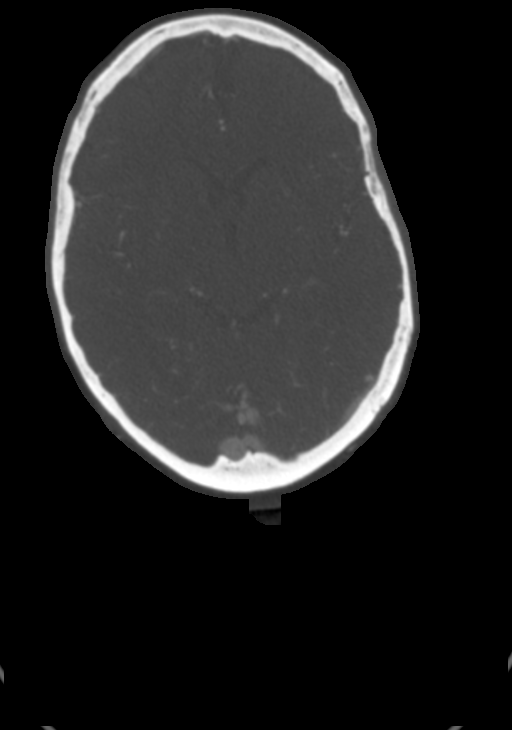
[im 262/315  soft-tissue]
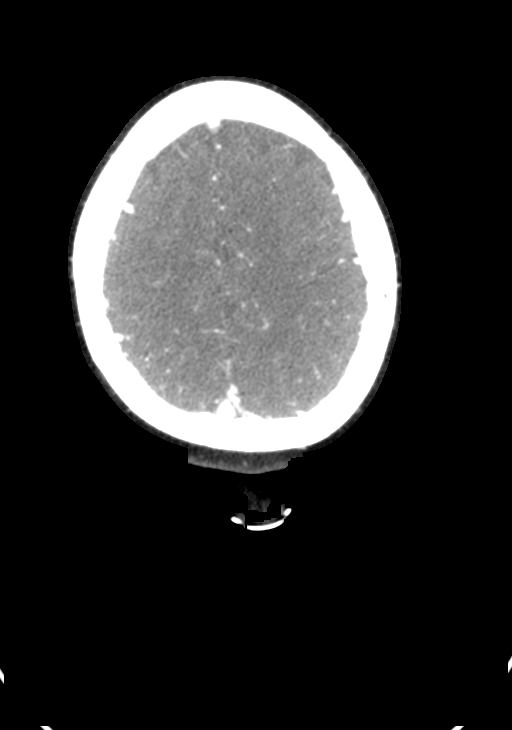

[5 of 33 positions shown; findings below may reference images not displayed]

FINDINGS: CT HEAD FINDINGS

Brain: Brainstem and cerebellum are normal. Question small area of
low-density in the right occipital cortical brain, best seen axial
image 15, which could represent a right occipital infarction. The
brain otherwise appears normal. No hemorrhage, hydrocephalus, mass
lesion or extra-axial collection.

Vascular: No abnormal vascular finding.

Skull: Normal

Sinuses: Clear

Orbits: Normal

Review of the MIP images confirms the above findings

CTA NECK FINDINGS

Aortic arch: Normal

Right carotid system: Normal. Carotid bifurcation is normal.
Cervical ICA is normal.

Left carotid system: Normal. Carotid bifurcation is normal. Cervical
ICA is normal.

Vertebral arteries: Both vertebral artery origins are widely patent.
The left vertebral artery is dominant. Both vertebral arteries
appear normal through the cervical region to the foramen magnum.

Skeleton: Ordinary cervical spondylosis.

Other neck: No mass or lymphadenopathy.

Upper chest: Normal

Review of the MIP images confirms the above findings

CTA HEAD FINDINGS

Anterior circulation: Both internal carotid arteries are widely
patent through the skull base and siphon regions. The anterior and
middle cerebral vessels are normal. No large vessel occlusion. No
aneurysm or vascular malformation.

Posterior circulation: Both vertebral arteries are widely patent to
the basilar. No basilar stenosis. Left PCA is normal. Right PCA is
nearly occluded 1 cm from the origin. There does appear to be minor
thready distal flow. This is most consistent with embolic occlusion.

Venous sinuses: Patent and normal.

Anatomic variants: None significant.

Review of the MIP images confirms the above findings
IMPRESSION: Embolic disease to the right PCA with near occlusion. No other
abnormal vascular finding.

Focal low-density in the right occipital lobe consistent with an
acute right occipital stroke. No hemorrhage or mass effect.

Findings discussed with Dr. Gosheva at 8033 hours.

## 2021-05-26 IMAGING — MR MR HEAD W/O CM
11 of 12 series · 40 of 48 positions shown · non-contrast
Comparison: CT studies same day.

CLINICAL DATA: Blurred vision, headache and nausea beginning
yesterday.

EXAM:
MRI HEAD WITHOUT CONTRAST
TECHNIQUE: Multiplanar, multiecho pulse sequences of the brain and surrounding
structures were obtained without intravenous contrast.

[Series 5: DWI · axial · 4.0mm · 0.88mm/px · z∈[-126,+11]mm · 4 of 36 slices shown (1 of 6)]
[im 1/36]
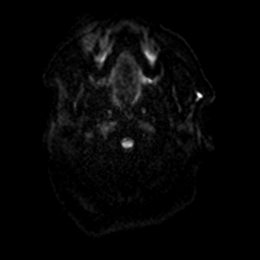
[im 12/36]
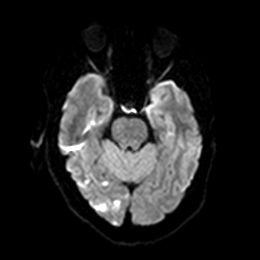
[im 24/36]
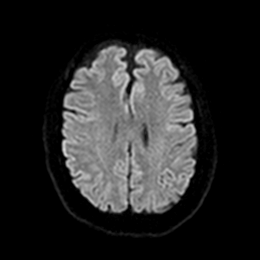
[im 36/36]
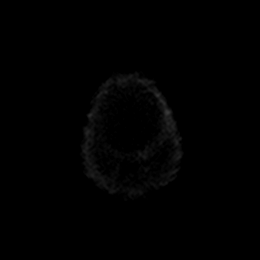

[Series 5: DWI · axial · 4.0mm · 0.88mm/px · z∈[-126,+11]mm · 4 of 36 slices shown (2 of 6)]
[im 1/36]
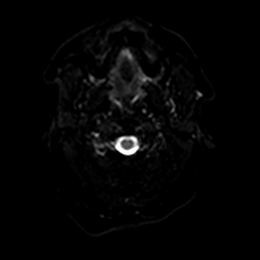
[im 12/36]
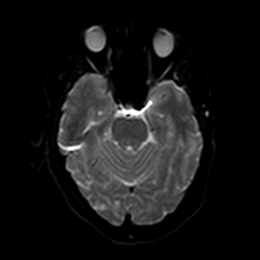
[im 24/36]
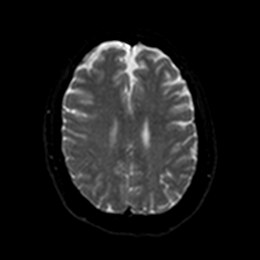
[im 36/36]
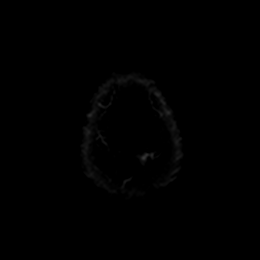

[Series 6: DWI · axial · 4.0mm · 0.88mm/px · z∈[-126,+11]mm · 4 of 36 slices shown (3 of 6)]
[im 1/36]
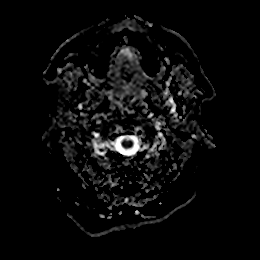
[im 12/36]
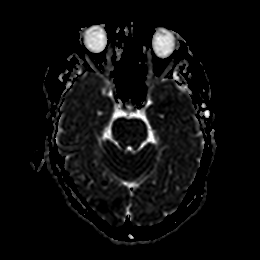
[im 24/36]
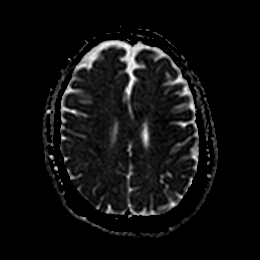
[im 36/36]
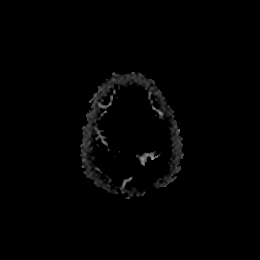

[Series 7: DWI · coronal · 5.0mm · 0.88mm/px · 3 of 28 slices shown (4 of 6)]
[im 1/28]
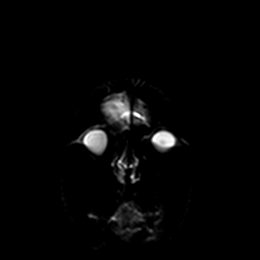
[im 14/28]
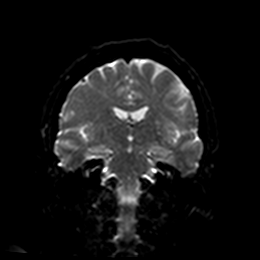
[im 28/28]
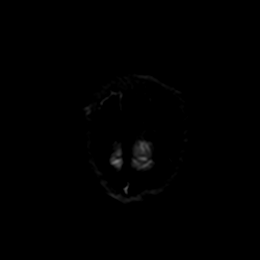

[Series 7: DWI · coronal · 5.0mm · 0.88mm/px · 4 of 28 slices shown (5 of 6)]
[im 1/28]
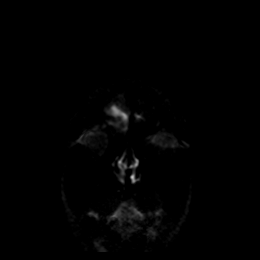
[im 10/28]
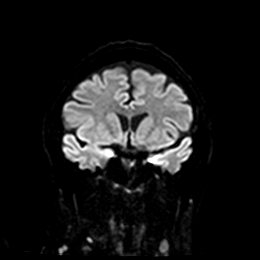
[im 19/28]
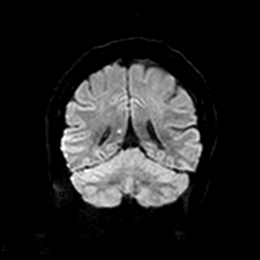
[im 28/28]
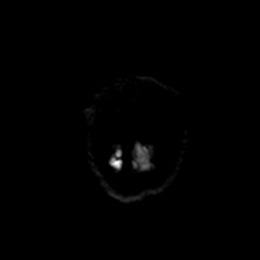

[Series 8: DWI · coronal · 5.0mm · 0.88mm/px · 4 of 28 slices shown (6 of 6)]
[im 1/28]
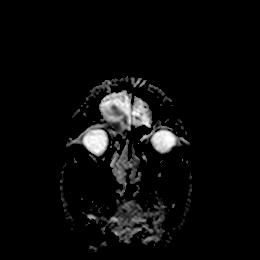
[im 10/28]
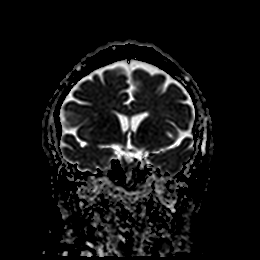
[im 19/28]
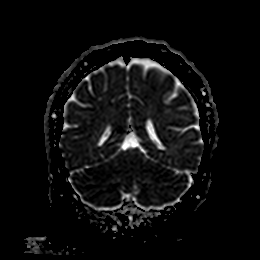
[im 28/28]
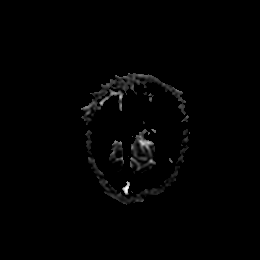

[Series 9: T1 · sagittal · 5.0mm · 0.94mm/px · 3 of 25 slices shown]
[im 1/25]
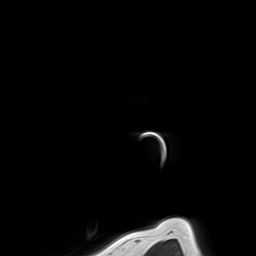
[im 13/25]
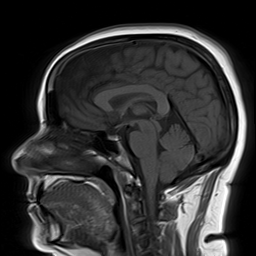
[im 25/25]
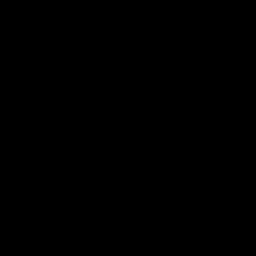

[Series 10: T2 · axial · 5.0mm · 0.72mm/px · z∈[-123,+7]mm · 3 of 20 slices shown (1 of 2)]
[im 1/20]
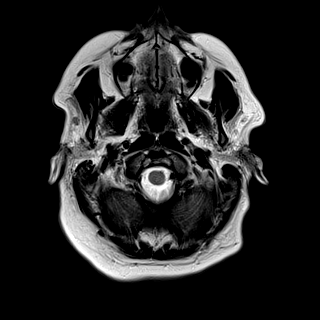
[im 10/20]
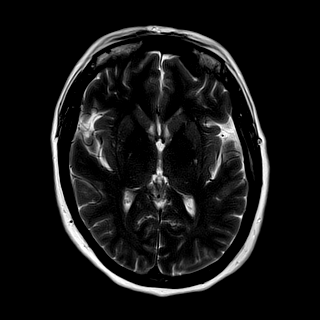
[im 20/20]
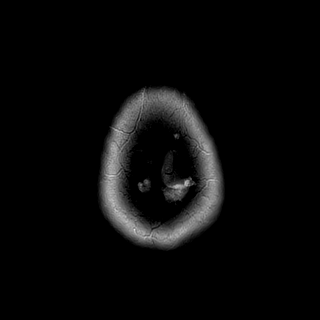

[Series 11: ax hemo · axial · 5.0mm · 0.86mm/px · z∈[-125,+16]mm · 3 of 25 slices shown]
[im 1/25]
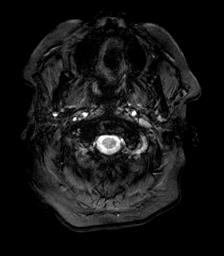
[im 13/25]
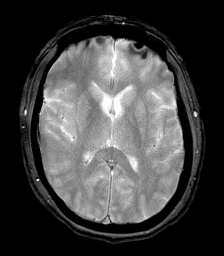
[im 25/25]
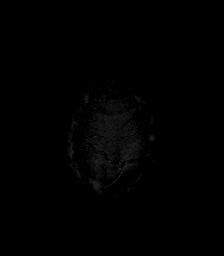

[Series 12: FLAIR · axial · 4.0mm · 0.43mm/px · z∈[-115,+7]mm · 4 of 32 slices shown]
[im 1/32]
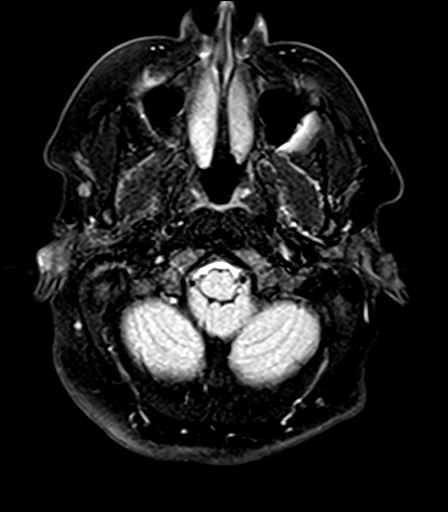
[im 11/32]
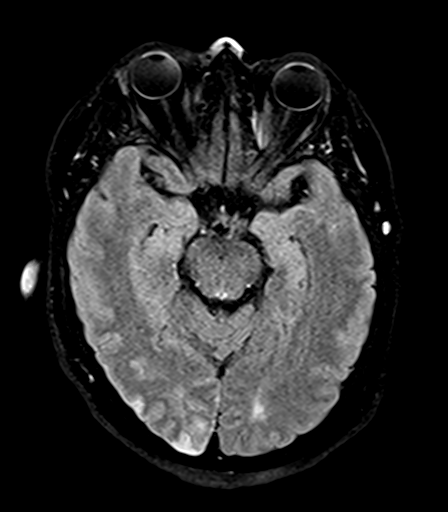
[im 21/32]
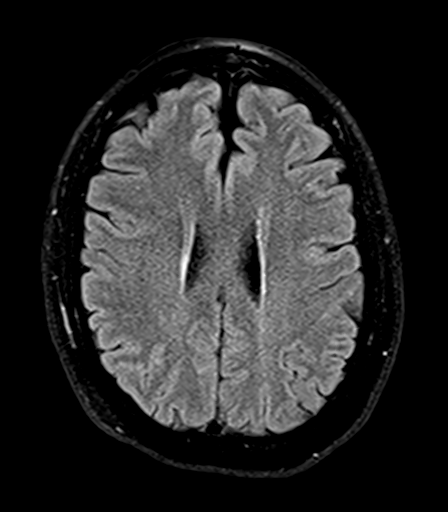
[im 32/32]
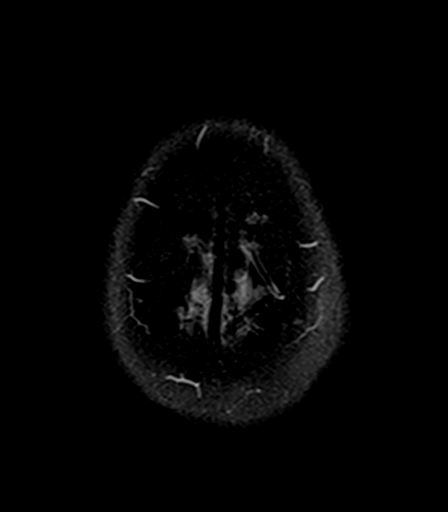

[Series 14: T2 · coronal · 5.0mm · 0.72mm/px · 4 of 28 slices shown (2 of 2)]
[im 1/28]
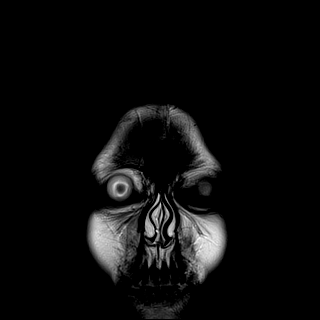
[im 10/28]
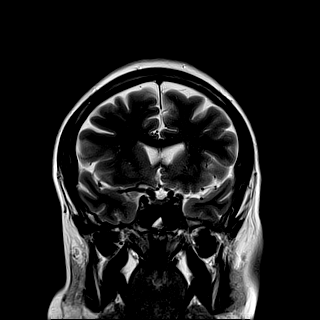
[im 19/28]
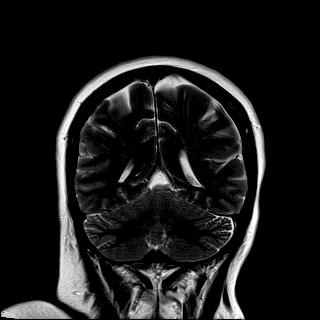
[im 28/28]
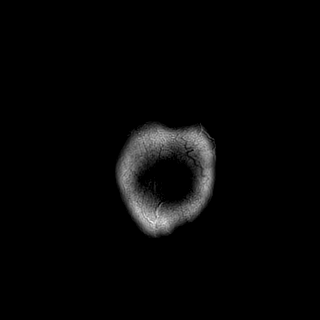

[40 of 48 positions shown; findings below may reference images not displayed]

FINDINGS: Brain: Diffusion imaging shows areas of patchy acute infarction in
the right occipital lobe. Acute infarction also affecting the right
side of the splenium of the corpus callosum. Small punctate acute
infarction within the right lateral thalamus. Findings consistent
with right PCA insult. No other acute infarction. The brain
otherwise has a normal appearance. No mass, hemorrhage,
hydrocephalus or extra-axial collection.

Vascular: Major vessels at the base of the brain show flow.

Skull and upper cervical spine: Negative

Sinuses/Orbits: Clear/normal

Other: None
IMPRESSION: Areas of acute infarction in the right PCA territory consistent with
the previous CT findings. No evidence of mass effect or hemorrhage.
Numerous patchy infarctions in the right occipital lobe. Involvement
also of the right side of the splenium of the corpus callosum.
Punctate involvement of the right lateral thalamus.

## 2021-09-23 ENCOUNTER — Other Ambulatory Visit: Payer: Self-pay | Admitting: Nurse Practitioner

## 2021-09-23 DIAGNOSIS — Z1231 Encounter for screening mammogram for malignant neoplasm of breast: Secondary | ICD-10-CM

## 2021-11-16 ENCOUNTER — Ambulatory Visit
Admission: RE | Admit: 2021-11-16 | Discharge: 2021-11-16 | Disposition: A | Discharge status: Home / Self Care | Payer: BC Managed Care – PPO | Source: Ambulatory Visit | Attending: Nurse Practitioner | Admitting: Nurse Practitioner

## 2021-11-16 DIAGNOSIS — Z1231 Encounter for screening mammogram for malignant neoplasm of breast: Secondary | ICD-10-CM

## 2021-11-18 ENCOUNTER — Other Ambulatory Visit: Payer: Self-pay | Admitting: Nurse Practitioner

## 2021-11-18 DIAGNOSIS — R928 Other abnormal and inconclusive findings on diagnostic imaging of breast: Secondary | ICD-10-CM

## 2022-01-14 ENCOUNTER — Other Ambulatory Visit: Payer: Self-pay | Admitting: Nurse Practitioner

## 2022-01-14 ENCOUNTER — Ambulatory Visit
Admission: RE | Admit: 2022-01-14 | Discharge: 2022-01-14 | Disposition: A | Discharge status: Home / Self Care | Payer: BC Managed Care – PPO | Source: Ambulatory Visit | Attending: Nurse Practitioner | Admitting: Nurse Practitioner

## 2022-01-14 DIAGNOSIS — N6489 Other specified disorders of breast: Secondary | ICD-10-CM

## 2022-01-14 DIAGNOSIS — R928 Other abnormal and inconclusive findings on diagnostic imaging of breast: Secondary | ICD-10-CM

## 2022-01-14 DIAGNOSIS — R921 Mammographic calcification found on diagnostic imaging of breast: Secondary | ICD-10-CM

## 2023-01-06 LAB — COLOGUARD: COLOGUARD: NEGATIVE

## 2023-01-06 LAB — EXTERNAL GENERIC LAB PROCEDURE: COLOGUARD: NEGATIVE

## 2023-08-22 ENCOUNTER — Other Ambulatory Visit: Payer: Self-pay | Admitting: Physician Assistant

## 2023-08-22 DIAGNOSIS — R921 Mammographic calcification found on diagnostic imaging of breast: Secondary | ICD-10-CM

## 2023-09-20 ENCOUNTER — Encounter
